# Patient Record
Sex: Female | Born: 1955 | Race: Black or African American | Hispanic: No | Smoking: Never smoker
Health system: Southern US, Community
[De-identification: ages and names within clinical notes are randomized; demographics above are authoritative.]

## PROBLEM LIST (undated history)

## (undated) DIAGNOSIS — R011 Cardiac murmur, unspecified: Secondary | ICD-10-CM

## (undated) DIAGNOSIS — I2510 Atherosclerotic heart disease of native coronary artery without angina pectoris: Secondary | ICD-10-CM

## (undated) DIAGNOSIS — E785 Hyperlipidemia, unspecified: Secondary | ICD-10-CM

## (undated) DIAGNOSIS — I10 Essential (primary) hypertension: Secondary | ICD-10-CM

## (undated) DIAGNOSIS — I739 Peripheral vascular disease, unspecified: Secondary | ICD-10-CM

## (undated) DIAGNOSIS — I701 Atherosclerosis of renal artery: Secondary | ICD-10-CM

## (undated) DIAGNOSIS — I6529 Occlusion and stenosis of unspecified carotid artery: Secondary | ICD-10-CM

## (undated) HISTORY — DX: Occlusion and stenosis of unspecified carotid artery: I65.29

## (undated) HISTORY — DX: Hyperlipidemia, unspecified: E78.5

## (undated) HISTORY — DX: Cardiac murmur, unspecified: R01.1

## (undated) HISTORY — DX: Peripheral vascular disease, unspecified: I73.9

## (undated) HISTORY — DX: Essential (primary) hypertension: I10

## (undated) HISTORY — DX: Atherosclerosis of renal artery: I70.1

## (undated) HISTORY — DX: Atherosclerotic heart disease of native coronary artery without angina pectoris: I25.10

---

## 1999-11-28 ENCOUNTER — Ambulatory Visit: Admission: RE | Admit: 1999-11-28 | Discharge: 1999-11-28 | Payer: Self-pay | Admitting: Cardiovascular Disease

## 1999-11-28 ENCOUNTER — Encounter: Payer: Self-pay | Admitting: Cardiovascular Disease

## 1999-11-28 HISTORY — PX: RENAL ANGIOGRAM: SHX6061

## 2003-09-12 HISTORY — PX: CARDIAC CATHETERIZATION: SHX172

## 2003-09-13 ENCOUNTER — Inpatient Hospital Stay (HOSPITAL_COMMUNITY): Admission: AD | Admit: 2003-09-13 | Discharge: 2003-09-18 | Payer: Self-pay | Admitting: *Deleted

## 2003-09-13 HISTORY — PX: CORONARY ARTERY BYPASS GRAFT: SHX141

## 2003-10-10 ENCOUNTER — Encounter: Admission: RE | Admit: 2003-10-10 | Discharge: 2003-10-10 | Payer: Self-pay | Admitting: Surgery

## 2003-11-13 ENCOUNTER — Encounter (HOSPITAL_COMMUNITY): Admission: RE | Admit: 2003-11-13 | Discharge: 2004-02-11 | Payer: Self-pay | Admitting: *Deleted

## 2007-08-05 ENCOUNTER — Ambulatory Visit (HOSPITAL_COMMUNITY): Admission: RE | Admit: 2007-08-05 | Discharge: 2007-08-05 | Payer: Self-pay | Admitting: *Deleted

## 2007-08-05 HISTORY — PX: CARDIAC CATHETERIZATION: SHX172

## 2007-09-01 ENCOUNTER — Observation Stay (HOSPITAL_COMMUNITY): Admission: EM | Admit: 2007-09-01 | Discharge: 2007-09-02 | Payer: Self-pay | Admitting: Emergency Medicine

## 2008-11-03 ENCOUNTER — Encounter: Admission: RE | Admit: 2008-11-03 | Discharge: 2008-11-03 | Payer: Self-pay

## 2008-11-07 ENCOUNTER — Ambulatory Visit (HOSPITAL_COMMUNITY): Admission: RE | Admit: 2008-11-07 | Discharge: 2008-11-07 | Payer: Self-pay | Admitting: Cardiovascular Disease

## 2008-11-07 HISTORY — PX: CAROTID ANGIOGRAM: SHX5765

## 2008-12-07 ENCOUNTER — Inpatient Hospital Stay (HOSPITAL_COMMUNITY): Admission: RE | Admit: 2008-12-07 | Discharge: 2008-12-08 | Payer: Self-pay | Admitting: Interventional Radiology

## 2008-12-07 HISTORY — PX: CAROTID ANGIOGRAM: SHX5765

## 2008-12-23 HISTORY — PX: RENAL ARTERY STENT: SHX2321

## 2008-12-25 ENCOUNTER — Inpatient Hospital Stay (HOSPITAL_COMMUNITY): Admission: RE | Admit: 2008-12-25 | Discharge: 2008-12-26 | Payer: Self-pay | Admitting: Cardiology

## 2009-08-08 ENCOUNTER — Ambulatory Visit (HOSPITAL_COMMUNITY): Admission: RE | Admit: 2009-08-08 | Discharge: 2009-08-08 | Payer: Self-pay | Admitting: Interventional Radiology

## 2010-09-29 LAB — CBC
Hemoglobin: 12.7 g/dL (ref 12.0–15.0)
MCHC: 33.3 g/dL (ref 30.0–36.0)
MCV: 92.5 fL (ref 78.0–100.0)
Platelets: 158 10*3/uL (ref 150–400)
RBC: 4.12 MIL/uL (ref 3.87–5.11)
WBC: 4.4 10*3/uL (ref 4.0–10.5)

## 2010-09-29 LAB — BASIC METABOLIC PANEL
CO2: 28 mEq/L (ref 19–32)
Calcium: 9 mg/dL (ref 8.4–10.5)
Creatinine, Ser: 1.06 mg/dL (ref 0.4–1.2)
GFR calc Af Amer: >60 mL/min (ref >60)

## 2010-09-29 LAB — PROTIME-INR: Prothrombin Time: 14 seconds (ref 11.6–15.2)

## 2010-10-21 LAB — BASIC METABOLIC PANEL
BUN: 19 mg/dL (ref 6–23)
BUN: 21 mg/dL (ref 6–23)
CO2: 27 mEq/L (ref 19–32)
Chloride: 104 mEq/L (ref 96–112)
GFR calc non Af Amer: 52 mL/min — ABNORMAL LOW (ref >60)
GFR calc non Af Amer: 58 mL/min — ABNORMAL LOW (ref >60)
Glucose, Bld: 134 mg/dL — ABNORMAL HIGH (ref 70–99)
Potassium: 3.9 mEq/L (ref 3.5–5.1)
Sodium: 142 mEq/L (ref 135–145)

## 2010-10-21 LAB — CBC
MCHC: 34.1 g/dL (ref 30.0–36.0)
MCV: 88.6 fL (ref 78.0–100.0)
Platelets: 184 10*3/uL (ref 150–400)
WBC: 13.9 10*3/uL — ABNORMAL HIGH (ref 4.0–10.5)

## 2010-10-22 LAB — PROTIME-INR
INR: 1.1 (ref 0.00–1.49)
INR: 1.5 (ref 0.00–1.49)
Prothrombin Time: 18.4 seconds — ABNORMAL HIGH (ref 11.6–15.2)

## 2010-10-22 LAB — CBC
Hemoglobin: 12 g/dL (ref 12.0–15.0)
RBC: 4.37 MIL/uL (ref 3.87–5.11)
RDW: 14.4 % (ref 11.5–15.5)
WBC: 6.7 10*3/uL (ref 4.0–10.5)

## 2010-10-22 LAB — DIFFERENTIAL
Basophils Relative: 1 % (ref 0–1)
Lymphocytes Relative: 33 % (ref 12–46)
Lymphs Abs: 2.2 10*3/uL (ref 0.7–4.0)
Monocytes Relative: 10 % (ref 3–12)
Neutro Abs: 3.5 10*3/uL (ref 1.7–7.7)
Neutrophils Relative %: 52 % (ref 43–77)

## 2010-10-22 LAB — BASIC METABOLIC PANEL
Calcium: 8.1 mg/dL — ABNORMAL LOW (ref 8.4–10.5)
Calcium: 9.3 mg/dL (ref 8.4–10.5)
Creatinine, Ser: 1.08 mg/dL (ref 0.4–1.2)
GFR calc Af Amer: >60 mL/min (ref >60)
GFR calc Af Amer: >60 mL/min (ref >60)
GFR calc non Af Amer: 59 mL/min — ABNORMAL LOW (ref >60)
Glucose, Bld: 185 mg/dL — ABNORMAL HIGH (ref 70–99)
Sodium: 137 mEq/L (ref 135–145)
Sodium: 140 mEq/L (ref 135–145)

## 2010-10-22 LAB — HEPARIN LEVEL (UNFRACTIONATED): Heparin Unfractionated: 0.25 IU/mL — ABNORMAL LOW (ref 0.30–0.70)

## 2010-10-22 LAB — APTT: aPTT: 27 seconds (ref 24–37)

## 2010-11-26 NOTE — Cardiovascular Report (Signed)
[name], [name]                ACCOUNT NO.:  [id number]   MEDICAL RECORD NO.:  [id number]          PATIENT TYPE:  OIB   LOCATION:  2854                         FACILITY:  [facility]   PHYSICIAN:  Darlin Priestly, MD  DATE OF BIRTH:  06-Jun-1956   DATE OF PROCEDURE:  08/05/2007  DATE OF DISCHARGE:                            CARDIAC CATHETERIZATION   PROCEDURES:  1. Left heart catheterization.  2. Coronary angiography.  3. Left ventriculogram.  4. Saphenous vein graft angiography.  5. Abdominal aortogram.   ATTENDING PHYSICIAN:  Darlin Priestly, M.D.   COMPLICATIONS:  None.   INDICATIONS:  Ms. Birkey is a 55 year old female with a history of  hypertension, history of PVD with known 70-80% left carotid stenosis as  well as abdominal aneurysm.  She does have significant CAD with a  history of subtotal occlusion of left main LAD and circumflex all  collateralized from the RCA by cath in March 2005.  She subsequently  underwent bypass surgery consisting of free RIMA to the LAD, vein graft  to the OM and vein graft to diagonal.  She recently underwent a follow-  up stress test revealing mild anterior and lateral wall ischemia.  She  is now referred for repeat catheterization to reassess her grafts.   DESCRIPTION OF OPERATION:  After obtaining informed consent, the patient  was brought to the cardiac cath lab.  Right groin was shaved, prepped  and draped in the usual sterile fashion.  ECG monitor was established.  Using modified Seldinger technique, a 6-French arterial sheath was  inserted in the right femoral artery.  6-French diagnostic catheters  performed diagnostic angiography.   Left main is subtotally occluded.   The LAD and circumflex are both subtotally occluded.   There is a patent free RIMA with insertion to the midportion of the LAD.  The LAD gives rise to two small diagonal branches.  There is no  significant disease in the free RIMA or LAD or diagonal system  beyond  the insertion.  From the same vein graft hood, the vein graft to the OM  originates.  The vein graft does fill the lower obtuse marginal which  bifurcates distally.  This vein graft is widely patent with no  significant disease in the graft or distal graft insertion.  This does  retrograde fill the AV groove circumflex.   The vein graft to the diagonal is totally occluded in its ostial  portion.   The right coronary artery is a large vessel which is dominant and gives  rise to the PDA as well as posterolateral branch.  There is mild 20%  distal RCA narrowing.  The PDA and posterolateral branch have no  significant disease.   Left ventriculogram reveals preserved EF at 50-55%.   Abdominal aortogram reveals 60% stenosis in the distal aorta just below  the renals.  There is a small aneurysm present which appears unchanged.   HEMODYNAMIC RESULTS:  Systemic arterial pressure 128/66, LV systemic  pressure 120/4, PA of 9.   CONCLUSION:  1. Significant left main and two-vessel coronary artery disease.  2. Patent  free right internal mammary artery to the left anterior      descending coronary artery with no significant disease in the body      of the graft or distal graft insertion.  3. Patent vein graft to the obtuse marginal with no significant      disease in the vein graft or distal to the graft insertion.  4. Totally occluded vein graft to the diagonal.  5. Normal left ventricular systolic function.  6. 60% distal aortic stenosis infrarenal with small infrarenal      aneurysm present.      Darlin Priestly, MD  Electronically Signed     RHM/MEDQ  D:  08/05/2007  T:  08/05/2007  Job:  [id number]

## 2010-11-26 NOTE — Discharge Summary (Signed)
[name], [name]                ACCOUNT NO.:  [id number]   MEDICAL RECORD NO.:  [id number]          PATIENT TYPE:  OIB   LOCATION:  3111                         FACILITY:  [facility]   PHYSICIAN:  Sanjeev K. Deveshwar, M.D.DATE OF BIRTH:  01-Jun-1956   DATE OF ADMISSION:  12/07/2008  DATE OF DISCHARGE:  12/08/2008                               DISCHARGE SUMMARY   CHIEF COMPLAINT:  Cerebrovascular disease status post right internal  carotid artery stent-assisted angioplasty performed on Dec 07, 2008.   BRIEF HISTORY:  This is a very pleasant 55 year old female from the  [place] who was referred to Dr. Corliss Skains through the courtesy of  Dr. Jacinto Halim for evaluation of cerebrovascular disease.  The patient had an  angiogram performed on November 07, 2008, by Dr. Jacinto Halim.  The intracranial  portion of the study was interpreted by Dr. Alfredo Batty on November 08, 2008.  Dr. Alfredo Batty felt that there were possibly 2 small aneurysms, one in the  right posterior inferior cerebellar artery and one in the basilar tip  artery.  Both were noted to be small.  There was also an outpouching  along the left supraclinoid internal carotid artery.  There was a high-  grade stenosis of the right distal cavernous and internal carotid artery  with poststenotic dilatation and delayed flow to the right middle  cerebral artery branches.  There was a filling defect of the right  anterior communicating artery via the left-sided injection by a patent  intercommunicating artery.   Dr. Corliss Skains saw the patient in consultation on Dec 07, 2008, to  discuss treatment options and further recommendations.  At that time,  stent-assisted angioplasty of the right internal carotid artery was  discussed along with the potential risks and benefits.  The patient and  her husband made a decision to proceed with endovascular treatment of  the stenosis.  The patient was admitted to [facility] on Dec 07, 2008, for that procedure.   PAST MEDICAL HISTORY:  The patient has a history of diffuse vascular  disease involving the abdominal aorta, the renal arteries, the lower  extremity vasculature as well as the cerebral and cardiac vasculature.  She has a long history of hypertension since age 86.  She has a history  of hyperlipidemia.  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  The patient had an MRI of the head on November 07, 2008, that did  not reveal any infarcts.  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  Her ejection fraction was estimated to be 50-55% at the time of  her cardiac catheterization performed on August 05, 2007.   SURGICAL HISTORY:  The patient had coronary artery bypass graft surgery  performed by Dr. Evelene Croon in March 2005.  She denies any previous  problems with anesthesia.   ALLERGIES:  The patient reported that she developed a rash approximately  2 days following her most recent angiogram.  Because of this, a decision  was made to  treat her with IV Benadryl and IV Solu-Medrol prior to her  intervention this admission.   Medications at the time of admission included Niaspan, Catapres, Zocor,  Norvasc, Micardis, Toprol, multivitamin, 81 mg of aspirin per day, and  75 mg of Plavix per day.   SOCIAL HISTORY:  The patient is married.  She lives in [place].  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  Her husband is a Designer, jewellery at [employer].  The patient does not smoke or use alcohol.   FAMILY HISTORY:  The patient's mother died at age 56.  Her father died  in his 37s.  There is a strong family history of cardiovascular and  peripheral vascular disease.   HOSPITAL COURSE:  The patient was admitted to [facility] on Dec 07, 2008, by Dr. Corliss Skains for endovascular treatment of a severe right  internal carotid artery stenosis.  A  cerebral angiogram was repeated on  the day of admission and a stent-assisted angioplasty was attempted.  The angioplasty was performed; however, the stent could not be placed in  the intended position due to severe tortuosity of the vessel.  Please  see Dr. Fatima Sanger dictation for complete details of the procedure.  Dr. Corliss Skains did feel that he obtained a good result with the  angioplasty.  The stent was deployed I believe proximal to the actual  stenosis.   The patient tolerated the procedure well.  She was admitted to the neuro  intensive care unit and remained on IV heparin overnight.  The following  day the IV heparin was discontinued and her right femoral groin sheath  was removed.  The patient is currently on bedrest.  Following 6 hours of  bedrest, she will be ambulated, if she remains stable, the plan will be  for discharge later today.   On the day of discharge, her blood pressure was running mildly low.  For  this reason, we did hold her Norvasc and Micardis that morning.  We did  give her her Catapres and Toprol as scheduled along with her aspirin and  Plavix.   LABORATORY DATA:  A basic metabolic panel on Dec 08, 2008, revealed a  BUN of 21, creatinine was 0.99.  Her GFR was 59, potassium was 38,  glucose was 185.  She had received steroids the day previously.  CBC on  Dec 08, 2008, revealed a hemoglobin of 12.0, hematocrit was 35.2, the  white blood cell count was 12, 800 again felt secondary to steroids, her  platelet count was 161,000.   DISCHARGE MEDICATIONS:  The patient was told to stay on the medication  she was on prior to admission including her aspirin and Plavix.  Please  see the list as noted above.  There were no new medications started this  admission.   The patient was given instructions regarding wound care.  She was told  not to drive or do anything strenuous for at least 2 weeks.  She was not  to return to work until cleared by Dr. Corliss Skains.    Dr. Corliss Skains will see the patient back in approximately 2 weeks  following discharge.  She will see Dr. Jacinto Halim as needed or as scheduled.  The patient does not have a primary care physician.   DISCHARGE DIAGNOSES:  1. Status post angioplasty of a severe right internal carotid artery      stenosis with attempted stent placement performed on Dec 07, 2008,  by Dr. Corliss Skains under general anesthesia.  2. Recent angiogram performed on November 07, 2008, by Dr. Jacinto Halim showing      diffuse vascular disease including an 80% left renal artery      stenosis, a totally occluded right renal artery, and possibly 2      small aneurysms.  3. History of coronary artery disease with coronary artery bypass      graft surgery performed in March 2005 by Dr. Evelene Croon.  4. Ejection fraction of 50-55% by heart catheterization performed      August 05, 2007, by Dr. Shirlee Latch.  5. History of hypertension.  6. History of hyperlipidemia.  7. Suspected contrast dye allergy treated with Solu-Medrol and      Benadryl prior to the intervention.  8. Elevated glucose levels with no history of diabetes with the Solu-      Medrol felt to be a contributing factor.  9. Elevated white blood cell count felt to be secondary to Solu-      Medrol.  The patient was afebrile.  10.Chest x-ray on November 03, 2008, showing stable mild cardiomegaly      with no active lung disease.      Delton See, P.A.    ______________________________  Grandville Silos. Corliss Skains, M.D.    DR/MEDQ  D:  12/08/2008  T:  12/09/2008  Job:  [id number]

## 2010-11-26 NOTE — Consult Note (Signed)
NAMESEHAJ, [name]                ACCOUNT NO.:  [id number]   MEDICAL RECORD NO.:  [id number]          PATIENT TYPE:  AMB   LOCATION:  SDS                          FACILITY:  [facility]   PHYSICIAN:  Sanjeev K. Deveshwar, M.D.DATE OF BIRTH:  1956-03-04   DATE OF CONSULTATION:  12/21/2008  DATE OF DISCHARGE:                                 CONSULTATION   DATE OF FOLLOWUP VISIT:  December 21, 2008.   CHIEF COMPLAINT:  Status post stent-assisted angioplasty of a severely  stenosed right internal carotid artery, performed on Dec 07, 2008.   BRIEF HISTORY:  This is a very pleasant 55 year old female from [place] was referred to Dr. Corliss Skains through the courtesy of Dr.  Jacinto Halim for evaluation of cerebrovascular disease.  An arteriogram had  been performed on November 07, 2008, by Dr. Jacinto Halim which was interpreted by  Dr. Marin Roberts.  The patient was found to have a high-grade  stenosis of the right distal cavernous and internal carotid artery with  poststenotic dilatation and delayed flow to the right middle cerebral  artery branches.  Dr. Corliss Skains saw the patient in consultation on Dec 07, 2008.  At that time, treatment options were discussed along with the  potential risks and benefits of endovascular stent-assisted angioplasty.  The patient and her husband decided to proceed with the intervention.  The stent-assisted angioplasty was performed on Dec 07, 2008, by Dr.  Corliss Skains under general anesthesia with no immediate or known  complications.  The patient returns today accompanied by her husband to  be seen in followup.   PAST MEDICAL HISTORY:  Significant for diffuse vascular disease of the  abdominal aorta, renal arteries, the lower extremities, cerebral, and  coronary arteries.  The patient is status post coronary artery bypass  graft surgery.  She has a long history of hypertension since the age of  69.  She is on multiple antihypertensive medications.  Her blood  pressure  did run low during her recent hospital admission and her  medications were adjusted.  The patient has a totally occluded right  renal artery.  She has an 80% stenosis of the left renal artery.  She  tells [place] today that she is scheduled to undergo a stent-assisted  angioplasty to be performed by Dr. Jacinto Halim on December 25, 2008.   The patient had an MRI of the brain on November 07, 2008, that did not  reveal any infarcts.  She has degenerative joint disease of her knees.  She has an ejection fraction of 50-55% by cardiac catheterization  performed in January 2009.   SURGICAL HISTORY:  The patient is status post coronary artery bypass  graft surgery performed by Dr. Evelene Croon in March 2005.  She denies  any previous problems with anesthesia.   ALLERGIES:  The patient developed a rash 2 days after her angiogram  performed by Dr. Jacinto Halim.  For this reason, we gave her IV Benadryl and IV  Solu-Medrol prior to her intervention on Dec 07, 2008.   CURRENT MEDICATIONS:  The patient is now on aspirin 325 mg daily and  Plavix 75 mg daily, which Dr. Corliss Skains recommended continuing for at  least a year.  She is also on Niaspan, Zocor, Catapres, Norvasc,  Micardis, Toprol, and a multivitamin.   SOCIAL HISTORY:  The patient is married.  She lives in [place].  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  Her husband is a Designer, jewellery at the [employer].  The patient does not smoke or use alcohol.   FAMILY HISTORY:  The patient's mother died at age 81.  Her father died  in his 110s.  There is a strong family history of cardiovascular disease  as well as peripheral vascular disease.   IMPRESSION AND PLAN:  The patient returns today to be seen in followup  by Dr. Corliss Skains after undergoing stent-assisted angioplasty of the  right internal carotid artery on Dec 07, 2008.  She is accompanied by  her husband.  The patient and her husband report  that she has been doing  well.  She continues on the above-noted medications including aspirin  and Plavix.  She has had no symptoms other than occasional dizziness  which she experiences if she does not take her blood pressure  medications.  Her blood pressure had been running mildly low during her  last hospitalization and we gave her husband some parameters regarding  which medications to hold if her blood pressure is too low.  We  recommended not holding the Catapres for fear of rebound hypertension.  We also recommended not holding her beta-blocker due to her history of  coronary artery disease.  They were told they could hold the Norvasc or  Micardis if her blood pressure was significantly low.   Dr. Corliss Skains has recommended continuing the Plavix for at least a year.  She is also to remain on aspirin 325 mg daily.   Dr. Corliss Skains reviewed the images from her recent procedure.  He showed  the patient and her husband pre and post-angioplasty images.  All of  their questions were answered.  Dr. Corliss Skains did give the patient  permission to resume driving and to return to work on June 14.  As noted  she is due to have a renal angioplasty on June 14.  We filled out a  disability form for the patient to cover her until the 14th.  Further  recommendations will be made by Dr. Jacinto Halim after the renal angioplasty.  The plan at this time would be to repeat a cerebral angiogram in  approximately 3 months.  We did recommend that she discuss further blood  pressure medication adjustments with Dr. Jacinto Halim.  Greater than 25 minutes  was spent on this follow up visit.      Delton See, P.A.    ______________________________  Grandville Silos. Corliss Skains, M.D.    DR/MEDQ  D:  12/21/2008  T:  12/22/2008  Job:  [id number]   cc:   Cristy Hilts. Jacinto Halim, MD

## 2010-11-26 NOTE — Cardiovascular Report (Signed)
[name], [name]                ACCOUNT NO.:  [id number]   MEDICAL RECORD NO.:  [id number]           PATIENT TYPE:   LOCATION:                                 FACILITY:   PHYSICIAN:  Vonna Kotyk R. Jacinto Halim, MD       DATE OF BIRTH:  [birth year]   DATE OF PROCEDURE:  12/23/2008  DATE OF DISCHARGE:                            CARDIAC CATHETERIZATION   PROCEDURE PERFORMED:  1. Abdominal aortogram.  2. Selective left femoral renal arteriography.  3. PTA and stenting of the left femoral artery.   INDICATIONS:  [name] is a 55 year old female with renovascular  hypertension, renal atherosclerosis single renal artery with a high-  grade left renal artery stenosis of 80% with greater than 70-80 mm  pressure gradient by the abdominal aortogram that was done recently  about a month ago.  Because of renal preservation and increasing  velocities across the left renal artery which has been on close  surveillance we felt that it was prudent to proceed with renal  angioplasty.   ABDOMINAL AORTOGRAM:  Abdominal aortogram revealed diffuse calcification  of the abdominal aorta.  There was a 50% infrarenal aortic stenoses.   Left renal artery shows a calcific 80% stenosis.   INTERVENTION DATA:  Successful PTA and stenting of the left renal artery  with implantation of a 5.5- x 15-mm Herculink Plus stent deployed at  peak of 12 atmospheric pressure.  The stenosis overall was reduced from  80% to 0% with excellent flow.  There was no pressure gradient across  the renal artery postangioplasty.   Total of 70 mL of contrast was utilized for diagnostic and  interventional procedure.   RECOMMENDATION:  The patient will be continued on aspirin and Plavix.  She is extremely high risk.  She is on appropriate medical and  aggressive medical therapy with high-dose statins and also Niaspan.  We  will continue the same.   TECHNIQUE OF PROCEDURE:  Under usual sterile precautions using a 6-  French left  femoral arterial access, a 6-French double renal artery  curve guide catheter was initially advanced to the abdominal aorta, but  because of inability to engage the renal artery, I used a 6-French LIMA  guide catheter and left renal artery was selectively cannulated.  Using  stabilizer guidewire and heparin for anticoagulation maintaining ACT of  greater than 200, balloon angioplasty was performed after crossing the  renal artery with a 4.0- x 16-mm Aviator balloon at 12 atmospheric  pressure for 40 seconds followed by stent implantation at 12 atmospheric  pressure for 40 seconds, and  the inflow of the stent was dilated at the same pressure for 20 seconds.  Overall, excellent results were noted.  The guidewire was withdrawn, and  guide catheter disengaged and pulled out of the body over a J-wire.  The  patient tolerated the procedure well.  No immediate complication was  noted.      [name]. Jacinto Halim, MD     JRG/MEDQ  D:  12/23/2008  T:  12/25/2008  Job:  [id number]
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
## 2010-11-26 NOTE — Discharge Summary (Signed)
[name], SASAKI                ACCOUNT NO.:  [id number]   MEDICAL RECORD NO.:  [id number]          PATIENT TYPE:  OIB   LOCATION:  3111                         FACILITY:  [facility]   PHYSICIAN:  Sanjeev K. Deveshwar, M.D.DATE OF BIRTH:  03-09-1956   DATE OF ADMISSION:  12/07/2008  DATE OF DISCHARGE:  12/08/2008                               DISCHARGE SUMMARY   ADDENDUM:  In the previous dictation of this discharge summary, I  mistakenly dictated that the stent had been deployed proximal to the  stenosis.  That was an error on my part.  The stent was actually  deployed in the proper position at the site of the stenosis.  The stent  was not fully expanded; however, there is hope that the stent will  continue to expand over time.  Please see Dr. Grandville Silos. Deveshwar's  dictated report for full details.   It was noted that the patient's blood pressure was running somewhat low  during her stay.  Her Micardis and Norvasc were held on the day of  discharge.  We also requested that her husband, who is a Landscape architect, check the patient's blood pressure at home.  He was given  parameters with regards to holding the Micardis and the Norvasc if  necessary.  He will hold both medications for a systolic blood pressure  of less than 120.  He will hold the Norvasc for a systolic blood  pressure of less than 140.  The Catapres and the metoprolol will be  continued as previously.      Delton See, P.A.    ______________________________  Grandville Silos. Corliss Skains, M.D.    DR/MEDQ  D:  12/08/2008  T:  12/08/2008  Job:  [id number]   cc:   Cristy Hilts. Jacinto Halim, MD

## 2010-11-26 NOTE — Cardiovascular Report (Signed)
NAMEMACKENZE, GRANDISON                ACCOUNT NO.:  [id number]   MEDICAL RECORD NO.:  [id number]          PATIENT TYPE:  AMB   LOCATION:  SDS                          FACILITY:  [facility]   PHYSICIAN:  Vonna Kotyk R. Jacinto Halim, MD       DATE OF BIRTH:  1955/08/29   DATE OF PROCEDURE:  11/07/2008  DATE OF DISCHARGE:  11/07/2008                            CARDIAC CATHETERIZATION   Primary operator: Jeanella Cara, MD  Assist: Nanetta Batty, MD   PROCEDURE PERFORMED:  1. Arch aortogram.  2. Selective cerebral angiography intracranial and extracranial.  3. Abdominal aortogram.  4. Selective left renal arteriography.   INDICATIONS:  Ms. Ndeye Tenorio is a 55 year old female with known  coronary artery disease and coronary artery bypass grafting in the past.  She also has hypertension.  She is vasculopath in spite of being on a  very aggressive medical therapy.  She has shown significant progression  of carotid velocities across the left internal carotid artery.  Given  this she was brought for cerebral angiography to evaluate the severity  of stenosis.  Renal arteriography was performed because of elevated  velocities and no renal artery stenosis from prior cardiac  catheterization.  She also has a new right renal artery occlusion by  renal Dopplers that were done on September 20, 2008.  Hence she was brought  to the Cardiac Catheterization Lab to evaluate her anatomy.   Arch aortogram.  The arch aortogram revealed mild calcification of the  arch of the aorta.  This is a type 1 arch.   Right coronary artery.  Right coronary artery is widely patent with mild  luminal irregularity.  There is a 50% stenosis in right internal carotid  artery.  External carotid artery shows a 30-40% stenosis.  Please see  the dictation.   For intracranial carotid arterial system, please see the dictation from  Radiology.   Left carotid artery.  Left common carotid artery showed mild  calcification and mild disease.  Left  internal carotid artery shows mild  diffuse disease with 30% stenosis.  Left external carotid artery shows a  high-grade 90% stenosis.   Left vertebral artery.  Left vertebral artery is a large vessel.  It is  smooth and normal.  It fills the contralateral vertebral arterial  system.  There is complicated filling noted.   Intracranial portion of the left vertebral artery please see the  dictation of Radiology.   Right vertebral artery.  Right vertebral artery is widely patent.  Because of severe calcification and tortuosity, the right vertebral  artery was not selectively cannulated.   IMPRESSION:  The velocities across the left internal carotid artery were  false.  This is secondary to acute angle and tortuosity and severe  calcification  and this essentially involves the external carotid artery  without involvement of the internal carotid artery.  Hence medical  therapy is only advised.   Preliminary evaluation of her cerebral arterial circulation intracranial  portion reveals high-grade stenosis of the right internal carotid artery  at the petrous portion before the origin of the M1 segment of the  middle  cerebral artery.  There is underfilling of the anterior cerebral artery.   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  I have requested Dr.  Kerby Nora to evaluate the cerebral angiography.  She may need  angioplasty of the right internal carotid artery intracranial portion.   Abdominal aortogram.  Abdominal aortogram revealed presence of one renal  artery on the left side.  It is heavily calcified.  Abdominal aorta  shows heavy calcification and a 50% stenosis of the infrarenal portion  of the abdominal aorta.   Selective renal arteriography.  There was a high-grade stenosis of 80%  in the left renal artery with pressure gradient greater than  70-80  mmHg.   Abdominal aortogram also revealed  a 50% stenosed abdominal aorta without  any significant pressure gradient.  (15 mmHg).  There is severe  tortuosity and severe calcification noted and it has appearance of a  porcine aorta.   RECOMMENDATIONS:  She will need renal angioplasty of the left renal  artery.  There is a new occlusion of the right renal artery.  She is  essentially behaving like a vasculopath.  She is already on very  aggressive medical therapy.   TECHNIQUE OF THE PROCEDURE:  Under usual sterile precautions using a 5-  French pigtail catheter an arch aortogram was performed in LAO  projection.   Using JB-1 catheter, selective cerebral arteriography was performed.  The catheter then pulled out of the body.  An abdominal aortogram was  performed through the pigtail catheter and selective renal arteriography  was performed through a 5-French JR-4 diagnostic catheter.  End-hole  catheter was utilized to perform a selective pullback across the  abdominal aortic stenosis.  The patient tolerated the procedure.  A  total of 220 mL of contrast was utilized for the diagnostic procedure.      Cristy Hilts. Jacinto Halim, MD  Electronically Signed     JRG/MEDQ  D:  11/07/2008  T:  11/07/2008  Job:  [id number]   cc:   Kerby Nora, MD

## 2010-11-26 NOTE — H&P (Signed)
NAMEIVELIS, NORGARD                ACCOUNT NO.:  [id number]   MEDICAL RECORD NO.:  [id number]          PATIENT TYPE:  OIB   LOCATION:  3172                         FACILITY:  [facility]   PHYSICIAN:  Sanjeev K. Deveshwar, M.D.DATE OF BIRTH:  Sep 03, 1955   DATE OF ADMISSION:  12/07/2008  DATE OF DISCHARGE:                              HISTORY & PHYSICAL   CHIEF COMPLAINT:  Cerebrovascular disease.   HISTORY OF PRESENT ILLNESS:  This is a pleasant 55 year old female from  [place] who was referred to Dr. Corliss Skains through the courtesy  of Dr. Jacinto Halim for evaluation of cerebrovascular disease.  The patient has  a history of diffuse vascular disease.  She had an angiogram performed  on November 07, 2008, by Dr. Jacinto Halim.  The intracranial portion of the study  was interpreted by Dr. Alfredo Batty on November 08, 2008.  Dr. Alfredo Batty felt  there was a question of a right posterior inferior cerebellar artery and  basilar tip aneurysms, both felt to be small.  There was an outpouching  along the left supraclinoid internal carotid artery.  There was a high-  grade stenosis of the right distal cavernous and internal carotid artery  with post-stenotic dilatation and delayed flow to the right middle  cerebral artery branches.  There was filling of the right anterior  communicating artery via the left-sided injection by a patent anterior  communicating artery.   Dr. Corliss Skains saw the patient in consultation on Dec 07, 2008, to  discuss treatment options and further recommendations.  At that time  stent-assisted angioplasty of the right internal carotid artery was  discussed along with the potential risks and benefits.  The patient and  her husband made a decision to proceed with endovascular treatment.  The  patient is admitted to [facility] today, Dec 07, 2008, for that  procedure.   PAST MEDICAL HISTORY:  Significant for diffuse vascular disease  involving the abdominal aorta, the renal arteries,  the lower extremity  vasculature as well as the cerebral and cardiac vasculature.  The  patient has a long history of hypertension since the age of 84.  She  also has a history of hyperlipidemia.  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  She had an MRI of the head on November 07, 2008, that showed no sign of a  previous cerebrovascular accident.  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  Her ejection fraction was estimated to be 50-55% at time  of a cardiac catheterization performed August 05, 2007.   SURGICAL HISTORY:  The patient had coronary artery bypass graft surgery  performed September 13, 2003, by Dr. Laneta Simmers.  She denies any previous  problems with anesthesia.   ALLERGIES:  The patient has no known drug allergies.  She does report  that she developed a rash 2 days after her most recent angiogram.  For  this reason we will provide her with prophylaxis with IV Benadryl 50 mg  as well as IV Solu-Medrol 125 mg prior to the procedure  today.   MEDICATIONS ON ADMISSION:  Niaspan, Catapres, Zocor, Norvasc, Micardis,  Toprol, multivitamin, aspirin 81 mg and Plavix 75 mg   SOCIAL HISTORY:  The patient is married.  She lives in [place].  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  Her husband is a Designer, jewellery at [employer].  The patient does not smoke or use alcohol.   FAMILY HISTORY:  The patient's mother died at age 48.  Her father died  in his 72s.  There is a strong family history of cardiovascular and  peripheral vascular disease.   REVIEW OF SYSTEMS:  A complete review of systems was completely negative  at time of admission except for arthritic pain in her knee.   LABORATORY DATA:  An INR on admission was 1.1 with a PT of 15, the PTT  was 27.  BUN was 15, creatinine was 1.08, potassium 3.8, her GFR was 53,  glucose was 148.  Hemoglobin 13.3, hematocrit  38.4, WBC 6.7 thousand,  platelets 188,000.  A chest x-ray on April 23 showed stable mild  cardiomegaly with no active lung disease.  An MRI of the brain on November 07, 2008, showed no acute intracranial abnormalities.  There was  moderate-for-age sequelae of chronic small-vessel disease.   PHYSICAL EXAMINATION AT TIME OF ADMISSION:  A very pleasant 55 year old  female in no acute distress.  VITAL SIGNS:  Blood pressure 126/77, pulse 56, respirations 16,  temperature 97.7, oxygen saturation 100% on room air.  HEENT:  Unremarkable.  NECK:  Revealed a left carotid bruit.  HEART:  Revealed regular rate and rhythm with distant heart sounds.  LUNGS:  Clear.  ABDOMEN:  Soft, nontender.  EXTREMITIES:  Revealed pulses to be intact with trace edema bilaterally.  Her airway was rated at a 1.  Her ASA scale was rated at a 4.  NEUROLOGIC EXAM:  Revealed the patient to be alert and oriented and  following commands.  Cranial nerves II-XII were grossly intact.  Sensation was intact to light touch.  Cerebellar testing was intact.  Motor strength was 5/5 throughout.   ADMISSION DIAGNOSES:  1. Cerebrovascular disease with a severe right internal carotid artery      stenosis.  2. Recent angiogram performed November 07, 2008, by Dr. Jacinto Halim showing      diffuse vascular disease including an 80% left renal artery      stenosis and a totally-occluded right renal artery, which was felt      to be new.  3. History of coronary artery disease with coronary artery bypass      graft surgery in March 2005 performed by Dr. Laneta Simmers.  4. Ejection fraction 50-55% by cardiac catheterization performed      August 05, 2007, by Dr. Jenne Campus.  5. Two possible small aneurysms noted on the angiogram performed November 07, 2008.  6. History of hypertension.  7. History of hyperlipidemia.  8. Possible CONTRAST DYE allergy.  9. Mildly elevated glucose levels with no history of diabetes      mellitus.   PLAN:  The patient  will be admitted to [facility] today for  endovascular treatment of a severely-stenosed right internal carotid  artery to be performed by Dr. Corliss Skains.  She has been provided  prophylaxis with IV Benadryl and IV Solu-Medrol due to a possible  CONTRAST DYE allergy.  It should be noted that the patient does not have  a  primary care physician.  She lists Dr. Jacinto Halim as her only physician  other than Dr. Corliss Skains.      Delton See, P.A.    ______________________________  Grandville Silos. Corliss Skains, M.D.    DR/MEDQ  D:  12/07/2008  T:  12/07/2008  Job:  [id number]   cc:   Cristy Hilts. Jacinto Halim, MD

## 2010-11-29 NOTE — Cardiovascular Report (Signed)
[facility]  Patient:    [name], [name]                         MRN: [id number] Proc. Date: 11/28/99 Attending:  Gerlene Burdock A. Alanda Amass, M.D. CC:         Lenise Herald, M.D.             Richard A. Alanda Amass, M.D.             [facility]                        Cardiac Catheterization  PROCEDURE:  Retrograde abdominal aortic catheterization, abdominal aortic angiogram, mid stream PA and lateral projection, bilateral iliac angiography, PA and oblique projections, bilateral selective renal angiography, transstenotic pressure gradient measurement, left renal artery, and abdominal aorta.  DESCRIPTION OF PROCEDURE:  The patient was brought in as a same day admission with creatinine 0.9, hydrated preoperatively, premedicated with 5 mg of Valium p.o. and given 2 mg of Versed for sedation in the lab.  Visipaque dye was used throughout the procedure.  Procedure was done through the RFA, entered percutaneously with a 18 thin-walled needle and single anterior puncture under 1% Xylocaine.  A 5 French short Cordis side-arm sheath was inserted. A Wholey wire was used to traverse the iliac system.  Guidewire exchanges were used throughout the procedure.  A 5 French Tennis Racquet catheter was used for abdominal aortic angiogram above the level of the renal arteries at 30 cc 20 cc per second.  A second injection of 30 cc 20 cc per second was done in the lateral projection.  The catheter was pulled back across the infrarenal abdominal aortic stenosis and transstenotic pressure gradient was approximately 8 mmHg.  A second injection above the iliac bifurcation was done at 20 cc 20 cc per second.  The catheter was exchanged for a short rate and then a IMA 5 French catheter in selective left and right renal angiograms were done using hand injections.  There was transstenotic gradient across the left renal artery with approximately 10 mmHg, but there may have been some  catheter damping because of angulation accounting for this.  The IMA was pulled down and the LCIA was accessed and left iliac angiography was done in a PA and oblique projections using hand injections.  The catheter was removed and right iliac angiography was done in the PA and oblique projections.  Visualization of the profunda SFA junction was seen bilaterally and only the proximal third of the SFA.  Because of functionally single kidney, with dye considerations and no significant history of claudication, further lower extremity angiography was not performed at this time.  PRESSURES:  Arterial pressures monitored throughout the procedure ranged 160-170/80-90 mmHg.  There was approximately 8 mm gradient across the infrarenal abdominal aortic stenosis.  There was probably no significant gradient across the proximal left renal artery on catheter pullback or if present this was less than 10 mmHg.  There was significant calcification of the infrarenal abdominal aorta and iliacs bilaterally.  ABDOMINAL AORTIC ANGIOGRAM:  Abdominal aortic angiogram showed a widely celiac and SMA access with no evidence of stenosis and good ______.  The right renal artery was totally occluded just after its origin with a concave obstruction compatible with a chronic total obstruction.  Probing this superficially with the [facility] wire it was obvious that this was a chronic obstruction that was extremely hard and  probably calcified and no further attempts were made.  The left renal artery had approximately 20-30% concentric narrowing that was not felt to be angiographically significant with good flow on selective injection and abdominal angiogram and probably no significant gradient or less than 10 mmHg.  The infrarenal abdominal aorta was calcific with moderate diffuse atherosclerosis and mild dilatation of the distal third.  There was a 50% eccentric calcific stenosis of the infrarenal abdominal  aorta with a pressure at approximately 10 mm.  There was less than 10 mmHg gradient across this.  The IMA was intact.  The iliac bifurcation was intact.  There was irregularity at 20-30% right common mid iliac narrowing which tapered down to about 30-40% at the right hypogastric origin.  The right hypogastric was intact.  There was 30% eccentric narrowing of the LCIA in the midportion moderately segmental.  There was an 80% proximal--mid left hypogastric stenosis.  There was a lumpy, bumpy appearance to the LEIA with approximately 30% narrowing in its segmental 50-60% narrowing.  The distal LEIA showed no significant narrowing and mild irregularity.  The left SFA had 30% narrowing proximally with good flow down to the proximal third of the thigh.  The left profunda was intact, however, there was a focal 90% eccentric stenosis of the proximal left profunda.  The REIA had 30% segmental narrowing.  The RCFA had no significant narrowing and was smooth.  The right profunda had mildly segmental 70% proximal stenosis but good flow.  The right SFA had a eccentric 90% proximal stenosis.  There was 30% narrowing of the proximal SFA and it was visualized down to the proximal third of the thigh.  There appeared to be good runoff from the SFAs bilaterally.  DISCUSSION:  This 55 year old [place] lady has been in the [place] since the fall of October 2000.  There is no history of smoking or ETOH use and there is a family history of hypertension, and MI of a brother at age 29.  She has a sister with hypertension as well.  There is hyperlipidemia with total cholesterol of 223, LDL of 140.  No history of angina or claudication and a normal 2-D echocardiogram except for mild concentric hypertrophy and probably a mild diastolic relaxation abnormality with an EF of 60% (October 21, 1999). She has a long history of hypertension and apparently in the past was told that she had renal artery stenosis  (approximately 20 years ago), but this was  not followed up.  She is on several blood pressure medications and followup duplex study shows a small right kidney of 8.4 cm, a compensated large left kidney at 13 cm and an increase velocity of the right renal artery, greater than 5 and 192 on the left.  She was referred for renal and abdominal angiography for these reasons. Creatinine is normal at 0.9.  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  She also has 50% infrarenal abdominal aortic stenosis, bilateral iliac disease and moderately severe left external iliac.  She has a very concerning high-grade stenosis of the left profunda and a high-grade focal stenosis of her proximal right SFA that appears fairly focal.  She also has moderate right profunda stenosis.  The profunda stenosis is concerning since if she develops SFA occlusion she may not have adequate collaterals.  I would recommend continued medical therapy of her hypertension.  Part of this may be exacerbated by high renin in producing right kidney but this is unknown.  She is at exceeding strong risk of progression of her abdominal aortic stenosis, iliac disease as well and will need close followup.  I would recommend lower extremity Dopplers.  Vigorous therapy for hyperlipidemia. The case will be discussed with her primary cardiologist, Dr. Jenne Campus.  CATHETERIZATION DIAGNOSES: 1. History of renal artery stenosis. 2. History of chronic hypertension. 3. Peripheral vascular disease without clinical claudication at present. 4. Nonsmoker. 5. Normal systolic function with probable diastolic dysfunction and left    ventricular hypertrophy on two-dimensional echocardiogram. 6. Mild left renal artery narrowing. 7. Moderate infrarenal abdominal aortic calcific eccentric stenosis. 8. Moderate left external iliac segmental disease. 9. High-grade proximal right superficial femoral artery and left  profunda    stenosis. DD:  11/28/99 TD:  12/03/99 Job: [id number]

## 2010-11-29 NOTE — Discharge Summary (Signed)
[name], [name]                          ACCOUNT NO.:  [id number]   MEDICAL RECORD NO.:  [id number]                   PATIENT TYPE:  INP   LOCATION:  2027                                 FACILITY:  [facility]   PHYSICIAN:  Evelene Croon, M.D.                  DATE OF BIRTH:  04/13/1956   DATE OF ADMISSION:  09/12/2003  DATE OF DISCHARGE:  09/18/2003                                 DISCHARGE SUMMARY   ADMISSION DIAGNOSIS:  Positive Cardiolite stress test.   DISCHARGE DIAGNOSES/SECONDARY DIAGNOSES:  1. Positive Cardiolite stress test status post cardiac catheterization.  2. 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  3. History of diffuse peripheral vascular occlusive disease involving the     abdominal aorta, renal arteries, lower extremity vasculature, and     cerebral vasculature.  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  She has known     infrarenal abdominal aortic stenosis.  There is a 90% left profunda     artery stenosis and 90 right SFA occlusion.  She has been followed by Dr.     Madilyn Fireman for her peripheral vascular disease.  4. History of hypertension since age 54.  5. History of hyperlipidemia.  6. Postoperative anemia status post transfusion of packed red blood cells.   PROCEDURES:  On September 13, 2003, she underwent median sternotomy for coronary  artery bypass grafting surgery times three using a free right internal  mammary artery graft to the left anterior descending coronary artery, with  saphenous vein graft to the diagonal branch of the left anterior descending,  and a saphenous vein graft to the obtuse marginal branch of the left  circumflex coronary artery, and endoscopic vein harvesting from the right  leg.  Surgeon was Dr. Evelene Croon.   On September 12, 2003, she underwent left heart catheterization, coronary  angiography, left  ventriculogram, left subclavian angiography, and an  abdominal aortogram by Dr. Lenise Herald.  Findings showed significant left  main and two vessel coronary artery disease, mildly depressed left  ventricular function, diffusely diseased and calcified left subclavian,  diffuse disease of the distal aorta with a 50 to 60% infrarenal stenosis and  small distal aneurysm, renal artery stenosis of 30%, totally occluded right  renal artery, and systemic hypertension.   ALLERGIES:  She has no known drug allergies.   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  She has a  history of diffuse vascular disease and recently underwent a follow up  carotid duplex which showed a high grade left internal carotid artery  stenosis.  She was asymptomatic but it was felt that she should require  cerebral angiograms and treatment  of her left carotid artery stenosis.  She  underwent a Cardiolite stress test in preparation for this.  She was without  cardiac symptoms at this time.  The stress Cardiolite however, showed a  large area of reversible ischemia in the anterior apical region.  Based on  this result, Dr. Jenne Campus felt that she should undergo cardiac  catheterization.  Subsequently she was scheduled for an elective admission  to [facility] on September 12, 2003 for this procedure.   HOSPITAL COURSE:  On September 12, 2003, the patient was electively admitted to  [facility] where she underwent cardiac catheterization as described  above.  Based on these findings, Dr. Laneta Simmers was consulted regarding possible  surgical revascularization.  After review of the angiogram and examination  of the patient, Dr. Laneta Simmers did feel that coronary artery bypass grafting  surgery was the best treatment option.  After Dr. Laneta Simmers discussed the  risks, benefits and alternatives with the patient and her husband, the  patient  agreed to proceed and her surgery was scheduled for the following  day September 13, 2003.   As planned, the patient did undergo coronary artery bypass grafting surgery  on September 13, 2003.  She tolerated this procedure well and was transferred  from the operating room to the surgical intensive care unit in stable  condition.  Later that evening, she was hemodynamically stable.  She was  also extubated, neurologically intact.   On the morning of postoperative day one, the patient remained  hemodynamically stable.  She did require a Neo-Synephrine drip initially for  hypotension.  Her morning labs all showed evidence of postoperative anemia  with a hemoglobin of 7.4 and hematocrit of 21.  She was transfused with one  unit of packed red blood cells.  Her hemoglobin showed minimal improvement  to 7.7 and she did eventually require an additional unit of blood which did  increase her hemoglobin and hematocrit to 9.2 and 27.1 respectively.  Her  postoperative chest x-ray showed bibasilar atelectasis, her EKG showed  normal sinus rhythm with non-specific T-wave abnormalities.  Her chest tube  output was minimal and therefore chest tubes were discontinued without  difficulty.   Over the next several days, the patient continued to progress.  She remained  hemodynamically stable.  She eventually was weaned from supplemental oxygen  and was saturating 95% on room air.  Her bowel and bladder function were  working appropriately.  Her pain was controlled on oral medications.  Her  incisions remained clean and dry without signs of infection.  She was  ambulating with steady gait.  Her lower extremities did show trace edema and  she was slightly above her preoperative weight and did require short term of  diuretic therapy.  Dr. Sharee Pimple primary concern was her pulmonary status.  Although she had been weaned from supplemental oxygen and was without shortness of breath, her chest x-ray on September 16, 2003 showed  significant  right lower lobe atelectasis without pneumothorax.  She also has small  pleural effusions bilaterally.  Based on this finding, he did order a follow  up chest x-ray on September 18, 2003.  If her chest x-ray was stable, he did feel  that she would be stable for discharge home later that day September 18, 2003.  These results did show a stable right effusion with a slight worsening of  her left effusion.  Her films were viewed by Dr. Laneta Simmers, however, and they  were overall felt  to be stable and the patient was discharged home later  that day.   LABORATORY DATA:  Recent laboratory data on September 18, 2003 - sodium was 135,  potassium 3.9, blood glucose 105, BUN 12, creatinine 1.0, calcium 8.6, white  blood count 9.7, hemoglobin 10.4, hematocrit 30.1, platelet count 314.   DISCHARGE INSTRUCTIONS:   DISCHARGE MEDICATIONS:  1. Aspirin 81 mg one p.o. day.  2. Lopressor 25 mg one p.o. b.i.d.  3. Altace 2.5 mg p.o. daily.  4. Lipitor 40 mg one p.o. daily.  5. Ferrous sulfate 300 mg one p.o. b.i.d. x30 days.  6. Laxative of choice as needed for constipation.  7. Tylox one to two tablets p.o. q. 4 to 6 h. p.r.n. pain.   ACTIVITY:  She is instructed to avoid driving or heavy lifting more than 10  pounds.   DIET:  She is to follow a low fat, low salt diet.   WOUND CARE:  She may shower daily and may clean her incisions gently with  mild soap and water.  She is to call the [facility] office if she develops fever  or for redness or drainage from her incision sites.   FOLLOWUP:  1. She is to follow up with Dr. Laneta Simmers in the CVTS office in approximately     three weeks.  The office will contact her specific appointment date and     time.  She is to have a chest x-ray at the [facility] diagnostic center     one hour before this appointment and is to bring her chest x-ray films     with her to her appointment with Dr. Laneta Simmers.  2. She is to schedule a two week follow up appointment with Dr.  Jenne Campus.      Jerold Coombe, P.A.                  Evelene Croon, M.D.    AWZ/MEDQ  D:  10/02/2003  T:  10/04/2003  Job:  [id number]   cc:   Evelene Croon, M.D.  [address]  [place]  [place] [address]  Fax: [phone]   Darlin Priestly, M.D.  [phone] [address], [address]  [place]  [place] [address]  Fax: [phone]

## 2010-11-29 NOTE — Op Note (Signed)
NAMELINEA, CALLES                          ACCOUNT NO.:  [id number]   MEDICAL RECORD NO.:  [id number]                   PATIENT TYPE:  INP   LOCATION:  2307                                 FACILITY:  [facility]   PHYSICIAN:  Evelene Croon, M.D.                  DATE OF BIRTH:  20-Dec-1955   DATE OF PROCEDURE:  09/13/2003  DATE OF DISCHARGE:                                 OPERATIVE REPORT   PREOPERATIVE DIAGNOSES:  High grade left main and severe two-vessel coronary  artery disease.   POSTOPERATIVE DIAGNOSES:  High grade left main and severe two-vessel  coronary artery disease.   OPERATION PERFORMED:  Median sternotomy, extracorporeal circulation,  coronary artery bypass grafting surgery times three using a free right  internal mammary artery graft to the left anterior descending coronary  artery, with saphenous vein graft to the diagonal branch of the left  anterior descending and a saphenous vein graft to the obtuse marginal branch  of the left circumflex coronary artery.  Endoscopic vein harvesting from the  right leg.   SURGEON:  Alleen Borne, M.D.   ASSISTANT:  1. Kerin Perna, M.D.  2. Pecola Leisure, [place]   ANESTHESIA:  General endotracheal.   INDICATIONS FOR PROCEDURE:  The patient is a 55 year old woman with history  of diffuse vascular disease who recently underwent a follow-up carotid  duplex which showed a high grade left internal carotid artery stenosis.  She  was asymptomatic but was felt to require a cerebral angiogram and treatment  of this left carotid stenosis.  She underwent a Cardiolite stress test in  preparation for this.  She had no cardiac symptoms.  The stress Cardiolite  showed a large area of reversible ischemia in the anterior apical region.  She underwent cardiac catheterization yesterday which showed essentially  left main occlusion with 99% plus diffuse narrowing of the left main.  The  proximal LAD was also 99% stenosed before a small  diagonal branch.  The LAD  was occluded after the diagonal branch. The mid and distal LAD filled by  collaterals from a large dominant right coronary artery that had no disease  in it.  The left circumflex was occluded proximally with filling of a large  marginal branch by collaterals from the right.  Left ventricular function  was well preserved with an ejection fraction of 45 to 50% with mild global  hypokinesis.  There was no gradient across the aortic valve and no mitral  regurgitation.  The patient was also noted to have diffusely diseased left  subclavian artery without significant stenosis.  The left internal mammary  artery could not be visualized.  There was extensive calcification of the  aortic arch.  There was also severe diffuse abdominal aortic disease with an  area of 50 to 60% infrarenal aortic stenosis and a small aneurysm. 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  The left renal artery had mild  stenosis.  After review of the angiogram and examination of the patient, it  was felt that coronary artery bypass graft surgery was the best treatment.  I discussed the operative procedure with the patient and her husband, who is  a Engineer, civil (consulting) here at [facility].  I discussed alternatives to surgery,  benefits, and risks including bleeding, blood transfusion, infection,  stroke, myocardial infarction and death.  They understood and agreed to  proceed.   DESCRIPTION OF PROCEDURE:  The patient was taken to the operating room and  placed on the table in supine position.  After induction of general  endotracheal anesthesia, a Foley catheter was placed in the bladder using  sterile technique.  Then the chest, abdomen and both lower extremities were  prepped and draped in the usual sterile manner.  The chest was entered  through a median sternotomy incision and the pericardium opened in the  midline.  Examination of the heart showed good ventricular contractility.  The  ascending aorta was of normal size.  There was no palpable plaque in the  ascending aorta but there was some calcified plaque present in the aortic  arch.   Then I examined the left internal mammary artery.  This was a small atretic  appearing artery and was unsuitable for use as a bypass conduit.  Therefore,  I examined the right internal mammary artery and this was a larger caliber  vessel.  This was harvested from the chest wall as a free graft.  I had  excellent blood flow through it.  At the same time, a segment of greater  saphenous vein was harvested from the right leg with the endoscopic vein  harvest technique.   Then the patient was heparinized and when an adequate activated clotting  time was achieved, the distal ascending aorta was cannulated using a 20  French aortic cannula for arterial inflow.  Venous outflow was achieved  using a two-stage venous cannula for the right atrial appendage.  An  antegrade cardioplegia and vent cannula was inserted in the aortic root.   The patient was placed on cardiopulmonary bypass and the distal coronary  arteries identified.  The LAD was a medium-sized graftable vessel distally.  The diagonal branch was small but graftable.  The obtuse marginal was a  large graftable vessel that had some patchy plaque in it distally.   Then the aorta was crossclamped and 500 mL of cold blood antegrade  cardioplegia was administered in the aortic root with quick arrest of the  heart.  Systemic hypothermia to 20 degrees centigrade and topical  hypothermia with iced saline was used.  A temperature probe was placed on  the septum and insulating pad in the pericardium.   The first distal anastomosis was performed to the obtuse marginal branch.  The internal diameter was about 2.5 mm.  The conduit used was a segment of  greater saphenous vein.  The anastomosis was performed in an end-to-side manner using continuous 7-0 Prolene suture.  Flow was measured  through the  graft and was excellent.  Then another dose of cardioplegia was given down  the vein graft and in the aortic root.   The second distal anastomosis was performed to the diagonal branch.  The  internal diameter was about 1.5 mm.  The conduit used was a second segment  of greater saphenous vein and the anastomosis performed in an end-to-side  manner using continuous 7-0 Prolene suture.  Flow  was measured through the  graft and was excellent.   Then the third distal anastomosis was performed to the distal portion of the  left anterior descending coronary artery.  The internal diameter was about  1.75 mm.  The conduit used was the free right internal mammary artery graft  and this was anastomosed in an end-to-side manner using continuous 8-0  Prolene suture.  The pedicle was tacked to epicardium with 6-0 Prolene  sutures.  The patient was rewarmed to 37 degrees centigrade.  With the cross-  clamp in place, the two proximal vein graft anastomoses were performed in  the aortic root in end-to-side manner using continuous 6-0 Prolene suture.  The proximal anastomosis of the internal mammary artery graft was performed  at the hood of the obtuse marginal vein graft in end-to-side manner using  continuous 7-0 Prolene suture.  Then the crossclamp was removed with time of  59 minutes.  There was spontaneous return of sinus rhythm.  The proximal and  distal anastomosis appeared hemostatic and line of the graft satisfactory.  Graft markers placed on the proximal anastomoses.  Two temporary right  ventricular and right atrial pacing wires placed and brought out through the  skin.   DICTATION ENDED HERE.                                               Evelene Croon, M.D.    BB/MEDQ  D:  09/13/2003  T:  09/13/2003  Job:  [id number]

## 2010-11-29 NOTE — Consult Note (Signed)
NAMEPETULA, ROTOLO                ACCOUNT NO.:  [id number]   MEDICAL RECORD NO.:  [id number]          PATIENT TYPE:  INP   LOCATION:  NA                           FACILITY:  [facility]   PHYSICIAN:  Marin Roberts, MDDATE OF BIRTH:  1955-10-29   DATE OF CONSULTATION:  11/08/2008  DATE OF DISCHARGE:                                 CONSULTATION   REFERRING PHYSICIAN:  Vonna Kotyk R. Jacinto Halim, MD   REASON FOR CONSULTATION:  Intracranial interpretation of cerebral  arteriogram.   FINDINGS:  Right common carotid injection.  There is a high-grade  stenosis of the distal cavernous right internal carotid artery with  poststenotic dilation.  There is no antegrade flow within the A1 segment  as a result of this.  No focal aneurysm is seen.  There is delayed  filling of the MCA branch vessels.  The dural sinuses fill normally.   Left common carotid injection:  There is a small downward outpouching of  the supraclinoid left internal carotid artery measuring 1-2 mm.  Mild  smooth narrowing of the proximal left A1 segment is less than 50%.  There is a patent anterior communicating artery with some retrograde  filling of the right A1 segment.  Both A2 segments fill from this  injection.   Left vertebral artery injection.  There is a focal wind sock outpouching  seen at the level of the right PICA.  This is seen with retrograde flow  in the distal right vertebral artery.  There is also a double shadow  seen at the basilar tip, suggesting small basilar tip aneurysm.  These  lesions are incompletely imaged with these views.   IMPRESSIONS:  1. 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  2. Small outpouching along the left supraclinoid internal carotid      artery.  3. High-grade stenosis of the right distal cavernous and internal      carotid artery with poststenotic dilation  and delayed flow to the      right MCA branch vessels.  4. Filling of the right ACA via the left-sided injection by a patent      anterior communicating artery.      Marin Roberts, MD  Electronically Signed     CM/MEDQ  D:  11/08/2008  T:  11/09/2008  Job:  [id number]

## 2010-11-29 NOTE — Consult Note (Signed)
[name], SMYRE                          ACCOUNT NO.:  [id number]   MEDICAL RECORD NO.:  [id number]                   PATIENT TYPE:  OIB   LOCATION:  2931                                 FACILITY:  [facility]   PHYSICIAN:  Evelene Croon, M.D.                  DATE OF BIRTH:  April 15, 1956   DATE OF CONSULTATION:  09/12/2003  DATE OF DISCHARGE:                                   CONSULTATION   REFERRING PHYSICIAN:  Darlin Priestly, M.D.   REASON FOR CONSULTATION:  Occluded left main and severe two-vessel coronary  artery disease.   HISTORY OF PRESENT ILLNESS:  This patient is a 55 year old woman from [place] whose husband is a Engineer, civil (consulting) in the transitional care unit. She has  long history of diffuse vascular disease involving the abdominal aorta,  renal arteries, lower extremity vasculature, and cerebrovasculature. She  apparently recently underwent a carotid ultrasound which showed a high grade  left internal carotid artery stenosis. She had cerebral angiography, but it  was felt that a Cardiolite stress test was indicated to rule out silent  ischemia since she had no cardiac symptoms. This showed a large reversible  anteroapical ischemic defect. Ejection fraction was about 54%. She underwent  cardiac catheterization today by Dr. Jenne Campus which showed an occluded left  main coronary artery. The LAD had a 99% proximal stenosis for a diagonal  branch. The LAD was occluded after the diagonal branch with filling of the  distal vessel by collaterals from a large dominant right coronary artery.  Left circumflex was occluded proximally with filling of a large marginal by  right and left collaterals. Left ventricular ejection fraction about 45 to  50% with mild global hypokinesis. There is diffuse calcific disease of the  left subclavian artery. The IMI was not visualized. There is also diffuse  abdominal aortic disease with 50 to 60% infrarenal aortic stenosis and a  small aneurysm.  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 There was no gradient across the aortic valve. There  was no mitral regurgitation.   PAST MEDICAL HISTORY:  1. Significant for hypertension since age 42.  2. She has a history of hyperlipidemia.  3. She has history of diffuse vascular disease. 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 She has a known infrarenal abdominal aortic stenosis. There is     known 90% left profunda artery stenosis and 90% right SFA occlusion. She     has been followed by Dr. Madilyn Fireman for her peripheral vascular disease. She     has had no prior surgery.   MEDICATIONS PRIOR TO ADMISSION:  1. Aspirin 325 mg q.d.  2. Toprol-XL 100 mg q.d.  3. Norvasc 10 mg q.d.  4. Lipitor 20 mg q.d.  5. Catapres TTS-2 patch.  6. Micardis/hydrochlorothiazide 40/12.5 mg q.d.   ALLERGIES:  None.   REVIEW OF SYSTEMS:  GENERAL:  She has had no recent weight changes. She  denies any fevers or chills. She has had some fatigue since working since  November. She attributes this to starting to work again and it usually  occurs late in the date after getting home from work. EYES:  She does have  some visual changes in her right eye at times noting some flashing sensation  as well as a dark line going across her vision. This usually resolves with  rest. ENT:  Negative. ENDOCRINE:  She denies diabetes and hypothyroidism.  CARDIOVASCULAR:  She denies any chest pain or pressure. She has had no  shortness of breath. She denies PND or orthopnea. She has had no  palpitations. RESPIRATORY:  She denies cough and sputum production.  GASTROINTESTINAL:  She has had no nausea or vomiting. She denies melena or  bright red blood per rectum. GENITOURINARY:  She has had no dysuria or  hematuria. NEUROLOGICAL:  She denies any focal weakness or numbness. She has  had occasional episodes of dizziness. She  denies any history of TIA or  stroke. HEMATOLOGICAL:  She denies any history of bleeding disorders or easy  bleeding. PSYCHIATRIC:  Negative.   FAMILY HISTORY:  Family history is positive for cardiovascular disease. Her  mother, father, and brother have had strokes.   SOCIAL HISTORY:  She is married to Antionette Poles who is a Engineer, civil (consulting) in the TCU at  [employer]. She works at the [employer] and [employer].  She has never smoked and denies alcohol abuse.   PHYSICAL EXAMINATION:  VITAL SIGNS:  Her blood pressure is 102/60 and her  pulse is 62 and regular. Respiratory rate is 16 and unlabored. Her weight is  168 pounds. She is 5 foot 3 inches. She is a well-developed, [place]  woman in no distress.  HEENT:  Shows to be normocephalic, atraumatic. Pupils are equal and reactive  to light and accommodation. Extraocular muscles are intact. Her throat is  clear.  NECK:  Shows normal carotid pulses bilaterally. There is left cervical  bruit.  CARDIAC:  Regular rate and rhythm with normal S1 and S2. There are no  murmurs, rubs, or gallops.  LUNGS:  Clear.  ABDOMEN:  Shows active bowel sounds. Her abdomen is soft and nontender.  There are no palpable masses or organomegaly.  EXTREMITIES:  Shows no peripheral edema. Pedal pulses are palpable  bilaterally.  SKIN:  Her skin is warm and dry.  NEUROLOGICAL:  Shows her to be alert and oriented x3. Motor and sensory  exams were normal.   LABORATORY DATA:  Shows hemoglobin of 13.1, platelet count of 301,000.  Coagulation profile was normal. BUN of 25 and creatinine of 1.0. Her albumin  was 4.3. Liver function profile was normal. LDL was 93 with a HDL of 41,  triglycerides of 123. Electrocardiogram shows normal sinus rhythm with no  acute changes.   IMPRESSION:  This patient has an essentially occluded left main coronary  artery and severe two-vessel coronary artery disease with her left coronary circulation supplied by collaterals  and a large dominant right coronary  artery. She has a large reversible anterior apical defect on her Cardiolite  scan. Although she is asymptomatic, I agree that she is at high risk for  further ischemia and infarction and coronary artery bypass graft surgery is  indicated to prevent  this. I discussed her case with Dr. Jerilee Field  concerning her left carotid stenosis. We repeated her carotid Doppler  examination in the hospital, and this shows a roughly 70% left internal  carotid artery stenosis. Dr. Hart Rochester reviewed the study with the vascular lab  and did not feel that concomitant left carotid endarterectomy was indicated  in this patient. I discussed the operative procedure of coronary artery  bypass surgery with the patient and her husband including alternatives,  benefits, and risks including bleeding, blood transfusion, infection,  stroke, myocardial infarction, renal failure. They understand and agree to  proceed with surgery. We will plan to do this on September 13, 2003.                                               Evelene Croon, M.D.    BB/MEDQ  D:  09/12/2003  T:  09/13/2003  Job:  [id number]

## 2010-11-29 NOTE — Cardiovascular Report (Signed)
[name], [name]                          ACCOUNT NO.:  [id number]   MEDICAL RECORD NO.:  [id number]                   PATIENT TYPE:  INP   LOCATION:  2307                                 FACILITY:  [facility]   PHYSICIAN:  Darlin Priestly, M.D.             DATE OF BIRTH:  09-23-1955   DATE OF PROCEDURE:  09/12/2003  DATE OF DISCHARGE:                              CARDIAC CATHETERIZATION   PROCEDURES:  1. Left heart catheterization.  2. Coronary angiography.  3. Left ventriculogram.  4. Left subclavian angiography.  5. Abdominal aortogram.   CARDIOLOGIST:  Darlin Priestly, M.D.   COMPLICATIONS:  None.   INDICATIONS:  [name] is a 55 year old female with a history of severe  hypertension, history of severe peripheral vascular disease, with 70-80%  left carotid stenosis, history of total right renal artery occlusion,  history of 50% infrarenal aorto-ostial lesion and a 90% left profundus  lesion with a 90% right SFA occlusion.  She recently was scheduled to  undergo a carotid angiogram and in preparation for this underwent a  Persantine Cardiolite suggesting anterior wall ischemia.  She is now  referred for cardiac catheterization to define her coronary anatomy.   DESCRIPTION OF PROCEDURE:  After obtaining informed written consent, the  patient was brought to the cardiac catheterization lab where her right and  left groins were shaved, prepped, and draped in the usual sterile fashion.  ECG monitoring was established.  Using modified Seldinger technique, a #6  French arterial sheath was threaded through the left femoral artery.  The 6  French diagnostic catheters were then used to perform diagnostic  angiography.  This reveals a calcified and diffusely diseased left main of  99%.  The LAD is subtotally occluded in its proximal portion and does feel  one small diagonal branch which appears to be diffusely diseased in its  proximal segment.  The left circumflex is totally  occluded in its proximal  segment.   The right coronary artery is a large vessel which is dominant.  It gives  rise to the PDA as well as the posterior lateral branch.  The RCA is  irregular but has no high grade stenosis.  There are multiple right to left  collaterals to the distal circumflex as well as LAD, retrograde filling the  LAD, diagonal septal perforator as well as two obtuse marginal branches.  The LAD appears to be a moderate size vessel at the distal portion as well  as a moderate size diagonal.   The left circumflex appears to be a large vessel with large bifurcating  obtuse marginal.   LEFT VENTRICULOGRAM:  The left ventriculogram reveals mildly ejection  fraction of 45-50% with mild global hypokinesis.   The left subclavian appears to be calcified and diffusely diseased and the  IMA is not visualized.   Abdominal aortogram reveals diffuse severe disease of the intrarenal  abdominal aorta.  The right  renal appears to be totally occluded.  The left  renal appears to be patent with 30% left renal artery stenosis.  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   HEMODYNAMIC DATA:  1. Systemic arterial pressure 142/79.  2. LV systemic pressure 146/11.  3. LVEDP of 15.   CONCLUSION:  1. Significant left main and two vessel coronary artery disease.  2. Mildly depressed left ventricular function systolic function.  3. Diffusely diseased and calcified left subclavian.  4. Diffuse disease of the distal aorta with a 50-60% infrarenal stenosis and     small distal aneurysm.  5. Renal artery stenosis of 30%, totally occluded right renal artery.  6. Systemic hypertension.                                               Darlin Priestly, M.D.    RHM/MEDQ  D:  09/12/2003  T:  09/13/2003  Job:  [id number]

## 2010-11-29 NOTE — Op Note (Signed)
NAMEADRIENE, [name]                          ACCOUNT NO.:  [id number]   MEDICAL RECORD NO.:  [id number]                   PATIENT TYPE:  INP   LOCATION:  2307                                 FACILITY:  [facility]   PHYSICIAN:  Evelene Croon, M.D.                  DATE OF BIRTH:  Nov 08, 1955   DATE OF PROCEDURE:  09/13/2003  DATE OF DISCHARGE:                                 OPERATIVE REPORT   CONTINUATION   When the patient had rewarmed to 37 degrees centigrade, she was weaned from  cardiopulmonary bypass on no inotropic agents.  Total bypass time was 85  minutes.  Cardiac function appeared excellent with a cardiac output of 6L  per minute.  Protamine was given and the venous and aortic cannulas were  removed without difficulty.  Hemostasis was achieved.  The patient was given  10 units of platelets since she was coagulopathic and had been on aspirin  and Plavix preoperatively.  Four chest tubes were placed with a bilateral  pleural tubes, a tube in the posterior pericardium, and one in the anterior  mediastinum.  The pericardium was loosely approximated over the heart.  The  sternum was closed with #6 stainless steel wires.  The fascia was closed  with continuous #1 Vicryl suture.  The subcutaneous tissue was closed with  continuous 2-0 Vicryl and the skin with 3-0 Vicryl subcuticular closure.  The lower extremity vein harvest site was closed in layers in a similar  manner.  Sponge, needle and instrument counts were correct according to the  scrub nurse.  Dry sterile dressings were applied over the incisions, around  the chest tubes which were hooked to Pleur-evac suction.  The patient  remained hemodynamically stable and was transported to the SICU in guarded  but stable condition.                                               Evelene Croon, M.D.    BB/MEDQ  D:  09/13/2003  T:  09/13/2003  Job:  [id number]   cc:   Darlin Priestly, M.D.  [address]  [place]  [place]  [address]  Fax: [phone]

## 2011-04-03 ENCOUNTER — Encounter: Payer: Self-pay | Admitting: Cardiovascular Disease

## 2011-04-04 LAB — BASIC METABOLIC PANEL
BUN: 19
Calcium: 8.7
GFR calc non Af Amer: >60
Glucose, Bld: 164 — ABNORMAL HIGH
Sodium: 137

## 2011-04-04 LAB — CBC
Hemoglobin: 12.1
Platelets: 244
RDW: 13

## 2011-08-20 ENCOUNTER — Encounter: Payer: Self-pay | Admitting: Cardiovascular Disease

## 2012-02-20 ENCOUNTER — Ambulatory Visit
Admission: RE | Admit: 2012-02-20 | Discharge: 2012-02-20 | Disposition: A | Discharge status: Home / Self Care | Payer: No Typology Code available for payment source | Source: Ambulatory Visit | Attending: Infectious Diseases | Admitting: Infectious Diseases

## 2012-02-20 ENCOUNTER — Other Ambulatory Visit: Payer: Self-pay | Admitting: Infectious Diseases

## 2012-02-20 DIAGNOSIS — R7612 Nonspecific reaction to cell mediated immunity measurement of gamma interferon antigen response without active tuberculosis: Secondary | ICD-10-CM

## 2012-03-08 ENCOUNTER — Other Ambulatory Visit (HOSPITAL_COMMUNITY): Payer: Self-pay | Admitting: Cardiovascular Disease

## 2012-03-08 DIAGNOSIS — I729 Aneurysm of unspecified site: Secondary | ICD-10-CM

## 2012-03-18 ENCOUNTER — Ambulatory Visit (HOSPITAL_COMMUNITY)
Admission: RE | Admit: 2012-03-18 | Discharge: 2012-03-18 | Disposition: A | Discharge status: Home / Self Care | Payer: BC Managed Care – PPO | Source: Ambulatory Visit | Attending: Cardiovascular Disease | Admitting: Cardiovascular Disease

## 2012-03-18 DIAGNOSIS — I712 Thoracic aortic aneurysm, without rupture, unspecified: Secondary | ICD-10-CM | POA: Insufficient documentation

## 2012-03-18 DIAGNOSIS — I729 Aneurysm of unspecified site: Secondary | ICD-10-CM

## 2012-03-18 MED ORDER — DIPHENHYDRAMINE HCL 50 MG/ML IJ SOLN
INTRAMUSCULAR | Status: AC
  Administered 2012-03-18: 50 mg via INTRAVENOUS
  Filled 2012-03-18: qty 1

## 2012-03-18 MED ORDER — METHYLPREDNISOLONE SODIUM SUCC 125 MG IJ SOLR
125.0000 mg | Freq: Once | INTRAMUSCULAR | Status: DC
  Filled 2012-03-18: qty 2

## 2012-03-18 MED ORDER — DIPHENHYDRAMINE HCL 50 MG/ML IJ SOLN
50.0000 mg | Freq: Once | INTRAMUSCULAR | Status: DC
  Filled 2012-03-18: qty 1

## 2012-03-18 MED ORDER — IOHEXOL 300 MG/ML  SOLN
100.0000 mL | Freq: Once | INTRAMUSCULAR | Status: AC | PRN
  Administered 2012-03-18: 100 mL via INTRAVENOUS

## 2012-03-18 MED ORDER — METHYLPREDNISOLONE SODIUM SUCC 125 MG IJ SOLR
INTRAMUSCULAR | Status: AC
  Administered 2012-03-18: 125 mg via INTRAVENOUS
  Filled 2012-03-18: qty 2

## 2012-03-24 ENCOUNTER — Other Ambulatory Visit: Payer: Self-pay | Admitting: Internal Medicine

## 2012-03-24 DIAGNOSIS — Z1231 Encounter for screening mammogram for malignant neoplasm of breast: Secondary | ICD-10-CM

## 2012-04-14 ENCOUNTER — Ambulatory Visit: Payer: BC Managed Care – PPO

## 2012-04-19 ENCOUNTER — Encounter: Payer: Self-pay | Admitting: Cardiovascular Disease

## 2012-07-22 ENCOUNTER — Ambulatory Visit
Admission: RE | Admit: 2012-07-22 | Discharge: 2012-07-22 | Disposition: A | Discharge status: Home / Self Care | Payer: 59 | Source: Ambulatory Visit | Attending: Internal Medicine | Admitting: Internal Medicine

## 2012-07-22 DIAGNOSIS — Z1231 Encounter for screening mammogram for malignant neoplasm of breast: Secondary | ICD-10-CM

## 2013-01-20 ENCOUNTER — Other Ambulatory Visit: Payer: Self-pay | Admitting: *Deleted

## 2013-01-20 MED ORDER — AMLODIPINE BESYLATE 5 MG PO TABS: 5.0000 mg | ORAL_TABLET | Freq: Every day | ORAL | Status: DC

## 2013-01-20 NOTE — Telephone Encounter (Signed)
She needs refill for amlodipine #90 supply sent [facility].

## 2013-04-06 ENCOUNTER — Other Ambulatory Visit: Payer: Self-pay | Admitting: *Deleted

## 2013-04-06 MED ORDER — LOSARTAN POTASSIUM 100 MG PO TABS: 100.0000 mg | ORAL_TABLET | Freq: Every day | ORAL | Status: DC

## 2013-04-06 MED ORDER — AMLODIPINE BESYLATE 5 MG PO TABS: 5.0000 mg | ORAL_TABLET | Freq: Every day | ORAL | Status: DC

## 2013-04-06 MED ORDER — NIACIN ER (ANTIHYPERLIPIDEMIC) 1000 MG PO TBCR: 1000.0000 mg | EXTENDED_RELEASE_TABLET | Freq: Every day | ORAL | Status: DC

## 2013-04-06 MED ORDER — CLOPIDOGREL BISULFATE 75 MG PO TABS: 75.0000 mg | ORAL_TABLET | Freq: Every day | ORAL | Status: DC

## 2013-04-06 MED ORDER — METOPROLOL SUCCINATE ER 100 MG PO TB24: 100.0000 mg | ORAL_TABLET | Freq: Every day | ORAL | Status: DC

## 2013-04-06 MED ORDER — ROSUVASTATIN CALCIUM 20 MG PO TABS: 20.0000 mg | ORAL_TABLET | Freq: Every day | ORAL | Status: DC

## 2013-04-06 NOTE — Telephone Encounter (Signed)
Pt request prescription and informed her she needs an appoinment Prescriptions give to her to take to [facility]

## 2013-04-06 NOTE — Telephone Encounter (Signed)
rx given to patient to take to [facility]

## 2013-07-27 ENCOUNTER — Ambulatory Visit (HOSPITAL_COMMUNITY): Admission: RE | Admit: 2013-07-27 | Payer: 59 | Source: Ambulatory Visit

## 2013-07-27 ENCOUNTER — Encounter: Payer: Self-pay | Admitting: Internal Medicine

## 2013-07-27 ENCOUNTER — Other Ambulatory Visit (HOSPITAL_COMMUNITY): Payer: Self-pay | Admitting: Internal Medicine

## 2013-07-27 ENCOUNTER — Ambulatory Visit (HOSPITAL_COMMUNITY): Payer: 59

## 2013-07-27 DIAGNOSIS — I712 Thoracic aortic aneurysm, without rupture, unspecified: Secondary | ICD-10-CM

## 2013-07-29 ENCOUNTER — Ambulatory Visit (HOSPITAL_COMMUNITY): Payer: 59

## 2013-07-29 ENCOUNTER — Other Ambulatory Visit (HOSPITAL_COMMUNITY): Payer: 59

## 2013-08-03 ENCOUNTER — Ambulatory Visit (HOSPITAL_COMMUNITY)
Admission: RE | Admit: 2013-08-03 | Discharge: 2013-08-03 | Disposition: A | Discharge status: Home / Self Care | Payer: 59 | Source: Ambulatory Visit | Attending: Internal Medicine | Admitting: Internal Medicine

## 2013-08-03 ENCOUNTER — Encounter (HOSPITAL_COMMUNITY): Payer: Self-pay

## 2013-08-03 ENCOUNTER — Ambulatory Visit (HOSPITAL_COMMUNITY): Admission: RE | Admit: 2013-08-03 | Payer: 59 | Source: Ambulatory Visit

## 2013-08-03 DIAGNOSIS — I712 Thoracic aortic aneurysm, without rupture, unspecified: Secondary | ICD-10-CM | POA: Insufficient documentation

## 2013-08-03 DIAGNOSIS — I359 Nonrheumatic aortic valve disorder, unspecified: Secondary | ICD-10-CM | POA: Insufficient documentation

## 2013-08-03 MED ORDER — IOHEXOL 350 MG/ML SOLN
100.0000 mL | Freq: Once | INTRAVENOUS | Status: AC | PRN
  Administered 2013-08-03: 100 mL via INTRAVENOUS

## 2013-09-28 ENCOUNTER — Other Ambulatory Visit: Payer: Self-pay | Admitting: *Deleted

## 2013-09-28 MED ORDER — ROSUVASTATIN CALCIUM 20 MG PO TABS: 20.0000 mg | ORAL_TABLET | Freq: Every day | ORAL | Status: DC

## 2013-09-28 MED ORDER — LOSARTAN POTASSIUM 100 MG PO TABS: 100.0000 mg | ORAL_TABLET | Freq: Every day | ORAL | Status: DC

## 2013-09-28 MED ORDER — METOPROLOL SUCCINATE ER 100 MG PO TB24: 100.0000 mg | ORAL_TABLET | Freq: Every day | ORAL | Status: DC

## 2013-09-29 ENCOUNTER — Other Ambulatory Visit: Payer: Self-pay | Admitting: *Deleted

## 2013-09-29 MED ORDER — CLOPIDOGREL BISULFATE 75 MG PO TABS: 75.0000 mg | ORAL_TABLET | Freq: Every day | ORAL | Status: DC

## 2013-12-14 ENCOUNTER — Other Ambulatory Visit: Payer: Self-pay | Admitting: *Deleted

## 2013-12-14 MED ORDER — LOSARTAN POTASSIUM 100 MG PO TABS: 100.0000 mg | ORAL_TABLET | Freq: Every day | ORAL | Status: DC

## 2014-04-18 ENCOUNTER — Other Ambulatory Visit: Payer: Self-pay | Admitting: *Deleted

## 2014-04-18 MED ORDER — AMLODIPINE BESYLATE 5 MG PO TABS: 5.0000 mg | ORAL_TABLET | Freq: Every day | ORAL | Status: DC

## 2014-04-18 NOTE — Telephone Encounter (Signed)
Patient requested a refill for Norvasc.  RX handed to patient.

## 2014-08-11 ENCOUNTER — Other Ambulatory Visit: Payer: Self-pay | Admitting: *Deleted

## 2014-08-11 ENCOUNTER — Encounter: Payer: Self-pay | Admitting: *Deleted

## 2014-08-11 ENCOUNTER — Encounter (HOSPITAL_COMMUNITY): Payer: Self-pay | Admitting: *Deleted

## 2014-08-11 DIAGNOSIS — I739 Peripheral vascular disease, unspecified: Secondary | ICD-10-CM

## 2014-09-19 ENCOUNTER — Ambulatory Visit (HOSPITAL_COMMUNITY)
Admission: RE | Admit: 2014-09-19 | Discharge: 2014-09-19 | Disposition: A | Discharge status: Home / Self Care | Payer: 59 | Source: Ambulatory Visit | Attending: Cardiology | Admitting: Cardiology

## 2014-09-19 DIAGNOSIS — I739 Peripheral vascular disease, unspecified: Secondary | ICD-10-CM

## 2014-09-19 DIAGNOSIS — I701 Atherosclerosis of renal artery: Secondary | ICD-10-CM | POA: Diagnosis not present

## 2014-09-19 DIAGNOSIS — N289 Disorder of kidney and ureter, unspecified: Secondary | ICD-10-CM | POA: Insufficient documentation

## 2014-09-19 DIAGNOSIS — I6523 Occlusion and stenosis of bilateral carotid arteries: Secondary | ICD-10-CM | POA: Diagnosis not present

## 2014-09-19 NOTE — Progress Notes (Signed)
Renal Duplex Completed. Mersedes Alber, BS, RDMS, RVT  

## 2014-09-19 NOTE — Progress Notes (Signed)
Carotid Duplex Completed. °Brianna L Mazza,RVT °

## 2014-09-21 ENCOUNTER — Other Ambulatory Visit: Payer: Self-pay | Admitting: *Deleted

## 2014-09-21 MED ORDER — AMLODIPINE BESYLATE 5 MG PO TABS: 5.0000 mg | ORAL_TABLET | Freq: Every day | ORAL | Status: DC

## 2014-09-21 NOTE — Telephone Encounter (Signed)
Request prescription for norvasc  to be E-sent to pharmacy. RN e-sent medication

## 2014-09-26 ENCOUNTER — Encounter: Payer: Self-pay | Admitting: *Deleted

## 2014-10-03 ENCOUNTER — Ambulatory Visit: Payer: 59 | Admitting: Cardiovascular Disease

## 2014-10-18 ENCOUNTER — Other Ambulatory Visit: Payer: Self-pay | Admitting: *Deleted

## 2014-10-18 MED ORDER — AMLODIPINE BESYLATE 5 MG PO TABS: 5.0000 mg | ORAL_TABLET | Freq: Every day | ORAL | Status: DC

## 2014-10-18 MED ORDER — METOPROLOL SUCCINATE ER 100 MG PO TB24: 100.0000 mg | ORAL_TABLET | Freq: Every day | ORAL | Status: DC

## 2014-10-18 MED ORDER — NIACIN ER (ANTIHYPERLIPIDEMIC) 1000 MG PO TBCR: 1000.0000 mg | EXTENDED_RELEASE_TABLET | Freq: Every day | ORAL | Status: DC

## 2014-10-18 MED ORDER — CLOPIDOGREL BISULFATE 75 MG PO TABS: 75.0000 mg | ORAL_TABLET | Freq: Every day | ORAL | Status: DC

## 2014-10-18 MED ORDER — LOSARTAN POTASSIUM 100 MG PO TABS: 100.0000 mg | ORAL_TABLET | Freq: Every day | ORAL | Status: DC

## 2014-10-18 MED ORDER — ROSUVASTATIN CALCIUM 20 MG PO TABS: 20.0000 mg | ORAL_TABLET | Freq: Every day | ORAL | Status: DC

## 2014-11-03 ENCOUNTER — Ambulatory Visit: Payer: 59 | Admitting: Cardiovascular Disease

## 2014-11-07 ENCOUNTER — Telehealth: Payer: Self-pay | Admitting: *Deleted

## 2014-11-07 MED ORDER — LOSARTAN POTASSIUM 100 MG PO TABS: 100.0000 mg | ORAL_TABLET | Freq: Every day | ORAL | Status: DC

## 2014-11-07 MED ORDER — CLOPIDOGREL BISULFATE 75 MG PO TABS: 75.0000 mg | ORAL_TABLET | Freq: Every day | ORAL | Status: DC

## 2014-11-07 NOTE — Telephone Encounter (Signed)
patient wanted prescription sent to pharmacy. [facility]. Paientt aware

## 2014-12-14 ENCOUNTER — Encounter: Payer: Self-pay | Admitting: Cardiovascular Disease

## 2014-12-15 ENCOUNTER — Ambulatory Visit (INDEPENDENT_AMBULATORY_CARE_PROVIDER_SITE_OTHER): Payer: 59 | Admitting: Cardiovascular Disease

## 2014-12-15 ENCOUNTER — Encounter: Payer: Self-pay | Admitting: Cardiovascular Disease

## 2014-12-15 VITALS — BP 158/90 | HR 64 | Ht 62.0 in | Wt 206.8 lb

## 2014-12-15 DIAGNOSIS — I10 Essential (primary) hypertension: Secondary | ICD-10-CM | POA: Insufficient documentation

## 2014-12-15 DIAGNOSIS — I2510 Atherosclerotic heart disease of native coronary artery without angina pectoris: Secondary | ICD-10-CM | POA: Insufficient documentation

## 2014-12-15 DIAGNOSIS — E785 Hyperlipidemia, unspecified: Secondary | ICD-10-CM | POA: Insufficient documentation

## 2014-12-15 DIAGNOSIS — N2889 Other specified disorders of kidney and ureter: Secondary | ICD-10-CM | POA: Insufficient documentation

## 2014-12-15 DIAGNOSIS — I2583 Coronary atherosclerosis due to lipid rich plaque: Secondary | ICD-10-CM

## 2014-12-15 DIAGNOSIS — I779 Disorder of arteries and arterioles, unspecified: Secondary | ICD-10-CM | POA: Insufficient documentation

## 2014-12-15 DIAGNOSIS — I739 Peripheral vascular disease, unspecified: Secondary | ICD-10-CM

## 2014-12-15 MED ORDER — LOSARTAN POTASSIUM 100 MG PO TABS: 100.0000 mg | ORAL_TABLET | Freq: Every day | ORAL | Status: DC

## 2014-12-15 MED ORDER — ROSUVASTATIN CALCIUM 20 MG PO TABS
20.0000 mg | ORAL_TABLET | Freq: Every day | ORAL | Status: DC
Start: 2014-12-15 — End: 2015-10-18

## 2014-12-15 MED ORDER — NIACIN ER (ANTIHYPERLIPIDEMIC) 1000 MG PO TBCR: 1000.0000 mg | EXTENDED_RELEASE_TABLET | Freq: Every day | ORAL | Status: DC

## 2014-12-15 MED ORDER — CLOPIDOGREL BISULFATE 75 MG PO TABS: 75.0000 mg | ORAL_TABLET | Freq: Every day | ORAL | Status: DC

## 2014-12-15 MED ORDER — AMLODIPINE BESYLATE 5 MG PO TABS: 5.0000 mg | ORAL_TABLET | Freq: Every day | ORAL | Status: DC

## 2014-12-15 MED ORDER — METOPROLOL SUCCINATE ER 100 MG PO TB24: 100.0000 mg | ORAL_TABLET | Freq: Every day | ORAL | Status: DC

## 2014-12-15 NOTE — Assessment & Plan Note (Signed)
History of coronary artery disease status post coronary artery bypass grafting March 2005 with a LIMA to LAD, vein to the circumflex and diagonal branch. She denies chest pain or shortness of breath. Her last Myoview performed 04/03/11 was low risk with breast attenuation artifact.

## 2014-12-15 NOTE — Assessment & Plan Note (Signed)
History of carotid artery disease status post stenting of her intracranial right internal carotid artery by Dr. Titus Dubinevashwar in the past. Her last carotid Doppler study performed 09/19/14 revealed moderately severe left ICA stenosis unchanged from her prior Doppler performed 3 years ago.

## 2014-12-15 NOTE — Assessment & Plan Note (Signed)
History of hypertension blood pressure measured at 158/90. She is on losartan and metoprolol. She admits to not taking her medicines as directed recently.

## 2014-12-15 NOTE — Assessment & Plan Note (Signed)
History of hyperlipidemia on rosuvastatin and niacin with recent lipid profile performed earlier this year by her primary care physician revealing a total cholesterol 182, LDL 102 and HDL of 56.

## 2014-12-15 NOTE — Progress Notes (Signed)
12/15/2014 [name]   04-04-1956  [id number]  Primary Physician Alysia PennaHOLWERDA, SCOTT, MD Primary Cardiologist: Runell GessJonathan J. Emilyann Banka MD Roseanne RenoFACP,FACC,FAHA, FSCAI   HPI:  [name] is a 59 year old female who I last saw in the office July 2013. She has a history of CAD status post CABG March 2005 with a LIMA to LAD, vein graft to the circumflex and diagonal branch. She has a known occluded right renal artery status post left renal artery stenting 12/25/08 with a 5.5 mm x 16 mm long Herculink balloon expandable stent. She's had right intracranial intracarotid intervention by Dr. Titus Dubinevashwar in the past. For the problems include hypertension and hyperlipidemia. She denies chest pain or shortness of breath. Her most recent carotid Dopplers performed 3 months ago showed stable moderately severe left ICA stenosis and renal Dopplers performed at the same time showed stable moderate "in-stent restenosis" within the left renal artery stent.   Current Outpatient Prescriptions  Medication Sig Dispense Refill  . amLODipine (NORVASC) 5 MG tablet Take 1 tablet (5 mg total) by mouth daily. 90 tablet 3  . clopidogrel (PLAVIX) 75 MG tablet Take 1 tablet (75 mg total) by mouth daily. 90 tablet 3  . losartan (COZAAR) 100 MG tablet Take 1 tablet (100 mg total) by mouth daily. 30 tablet 3  . metoprolol succinate (TOPROL-XL) 100 MG 24 hr tablet Take 1 tablet (100 mg total) by mouth daily. Take with or immediately following a meal. 90 tablet 3  . niacin (NIASPAN) 1000 MG CR tablet Take 1 tablet (1,000 mg total) by mouth at bedtime. 90 tablet 3  . rosuvastatin (CRESTOR) 20 MG tablet Take 1 tablet (20 mg total) by mouth daily. 90 tablet 3   No current facility-administered medications for this visit.    Allergies  Allergen Reactions  . Contrast Media [Iodinated Diagnostic Agents] Itching    History   Social History  . Marital Status: Married    Spouse Name: N/A  . Number of Children: N/A  . Years of Education:  N/A   Occupational History  . Not on file.   Social History Main Topics  . Smoking status: Never Smoker   . Smokeless tobacco: Not on file  . Alcohol Use: Not on file  . Drug Use: Not on file  . Sexual Activity: Not on file   Other Topics Concern  . Not on file   Social History Narrative     Review of Systems: General: negative for chills, fever, night sweats or weight changes.  Cardiovascular: negative for chest pain, dyspnea on exertion, edema, orthopnea, palpitations, paroxysmal nocturnal dyspnea or shortness of breath Dermatological: negative for rash Respiratory: negative for cough or wheezing Urologic: negative for hematuria Abdominal: negative for nausea, vomiting, diarrhea, bright red blood per rectum, melena, or hematemesis Neurologic: negative for visual changes, syncope, or dizziness All other systems reviewed and are otherwise negative except as noted above.    Blood pressure 158/90, pulse 64, height 5\' 2"  (1.575 m), weight 206 lb 12.8 oz (93.804 kg).  General appearance: alert and no distress Neck: no adenopathy, no JVD, supple, symmetrical, trachea midline, thyroid not enlarged, symmetric, no tenderness/mass/nodules and left carotid bruit Lungs: clear to auscultation bilaterally Heart: regular rate and rhythm, S1, S2 normal, no murmur, click, rub or gallop Extremities: extremities normal, atraumatic, no cyanosis or edema  EKG normal sinus rhythm of 64 without ST or T-wave changes. I personally reviewed this EKG  ASSESSMENT AND PLAN:   Unilateral carotid artery disease History  of carotid artery disease status post stenting of her intracranial right internal carotid artery by Dr. Titus Dubin in the past. Her last carotid Doppler study performed 09/19/14 revealed moderately severe left ICA stenosis unchanged from her prior Doppler performed 3 years ago.   Renal vascular disease History of known occlusion of the right renal artery demonstrated angiographically  12/25/08 status post stenting of the left renal artery with a 5 x 5 mm x 15 L long Herculink balloon expandable stent. Her last renal Doppler study performed 09/19/14 showed stable "in-stent restenosis" within the left renal artery stent and stable renal dimensions.   Hyperlipidemia History of hyperlipidemia on rosuvastatin and niacin with recent lipid profile performed earlier this year by her primary care physician revealing a total cholesterol 182, LDL 102 and HDL of 56.   Essential hypertension History of hypertension blood pressure measured at 158/90. She is on losartan and metoprolol. She admits to not taking her medicines as directed recently.   Coronary artery disease History of coronary artery disease status post coronary artery bypass grafting March 2005 with a LIMA to LAD, vein to the circumflex and diagonal branch. She denies chest pain or shortness of breath. Her last Myoview performed 04/03/11 was low risk with breast attenuation artifact.       Runell Gess MD FACP,FACC,FAHA, [facility] 12/15/2014 12:32 PM

## 2014-12-15 NOTE — Assessment & Plan Note (Signed)
History of known occlusion of the right renal artery demonstrated angiographically 12/25/08 status post stenting of the left renal artery with a 5 x 5 mm x 15 L long Herculink balloon expandable stent. Her last renal Doppler study performed 09/19/14 showed stable "in-stent restenosis" within the left renal artery stent and stable renal dimensions.

## 2014-12-15 NOTE — Patient Instructions (Signed)
Your physician wants you to follow-up in: 1 year with Dr Berry. You will receive a reminder letter in the mail two months in advance. If you don't receive a letter, please call our office to schedule the follow-up appointment.  

## 2015-01-01 ENCOUNTER — Telehealth: Payer: Self-pay | Admitting: *Deleted

## 2015-01-01 ENCOUNTER — Other Ambulatory Visit: Payer: Self-pay | Admitting: *Deleted

## 2015-01-01 DIAGNOSIS — L237 Allergic contact dermatitis due to plants, except food: Secondary | ICD-10-CM

## 2015-01-01 MED ORDER — METHYLPREDNISOLONE 4 MG PO TBPK: ORAL_TABLET | ORAL | Status: DC

## 2015-01-01 NOTE — Telephone Encounter (Signed)
Pt. Seen by Dr. Royann Shivers and prescribed a medrol dose pack

## 2015-01-01 NOTE — Telephone Encounter (Signed)
Pt. In office and c/o itching and redness on her neck and arms. Dr. Royann Shivers examined pt. And prescribed a prednisone dose pack

## 2015-02-11 ENCOUNTER — Other Ambulatory Visit: Payer: Self-pay | Admitting: Cardiovascular Disease

## 2015-02-12 NOTE — Telephone Encounter (Signed)
REFILL 

## 2015-04-26 ENCOUNTER — Other Ambulatory Visit: Payer: Self-pay | Admitting: Cardiovascular Disease

## 2015-04-26 DIAGNOSIS — I701 Atherosclerosis of renal artery: Secondary | ICD-10-CM

## 2015-05-01 ENCOUNTER — Ambulatory Visit (HOSPITAL_COMMUNITY)
Admission: RE | Admit: 2015-05-01 | Discharge: 2015-05-01 | Disposition: A | Discharge status: Home / Self Care | Payer: 59 | Source: Ambulatory Visit | Attending: Cardiovascular Disease | Admitting: Cardiovascular Disease

## 2015-05-01 DIAGNOSIS — E785 Hyperlipidemia, unspecified: Secondary | ICD-10-CM | POA: Diagnosis not present

## 2015-05-01 DIAGNOSIS — I700 Atherosclerosis of aorta: Secondary | ICD-10-CM | POA: Diagnosis not present

## 2015-05-01 DIAGNOSIS — I701 Atherosclerosis of renal artery: Secondary | ICD-10-CM | POA: Insufficient documentation

## 2015-05-01 DIAGNOSIS — I2510 Atherosclerotic heart disease of native coronary artery without angina pectoris: Secondary | ICD-10-CM | POA: Diagnosis not present

## 2015-05-01 DIAGNOSIS — K551 Chronic vascular disorders of intestine: Secondary | ICD-10-CM | POA: Insufficient documentation

## 2015-05-01 DIAGNOSIS — I10 Essential (primary) hypertension: Secondary | ICD-10-CM | POA: Insufficient documentation

## 2015-06-19 ENCOUNTER — Other Ambulatory Visit: Payer: Self-pay

## 2015-06-19 MED ORDER — LOSARTAN POTASSIUM 100 MG PO TABS: ORAL_TABLET | ORAL | Status: DC

## 2015-10-18 ENCOUNTER — Other Ambulatory Visit: Payer: Self-pay

## 2015-10-18 ENCOUNTER — Other Ambulatory Visit: Payer: Self-pay | Admitting: Cardiovascular Disease

## 2015-10-18 DIAGNOSIS — L237 Allergic contact dermatitis due to plants, except food: Secondary | ICD-10-CM

## 2015-10-18 NOTE — Telephone Encounter (Signed)
Rx request sent to pharmacy.  

## 2015-10-18 NOTE — Telephone Encounter (Signed)
Medications called to [facility], per pt request 90 day supply. Pt scheduled for annual f/u appt on 6/6

## 2015-12-04 ENCOUNTER — Other Ambulatory Visit: Payer: Self-pay | Admitting: Cardiology

## 2015-12-04 DIAGNOSIS — F418 Other specified anxiety disorders: Secondary | ICD-10-CM

## 2015-12-04 MED ORDER — ALPRAZOLAM 0.25 MG PO TABS: 0.2500 mg | ORAL_TABLET | Freq: Three times a day (TID) | ORAL | Status: DC | PRN

## 2015-12-18 ENCOUNTER — Ambulatory Visit: Payer: 59 | Admitting: Cardiovascular Disease

## 2016-01-18 ENCOUNTER — Other Ambulatory Visit: Payer: Self-pay | Admitting: Cardiovascular Disease

## 2016-01-21 ENCOUNTER — Other Ambulatory Visit: Payer: Self-pay | Admitting: Cardiovascular Disease

## 2016-02-15 ENCOUNTER — Encounter: Payer: Self-pay | Admitting: Cardiovascular Disease

## 2016-02-15 ENCOUNTER — Ambulatory Visit (INDEPENDENT_AMBULATORY_CARE_PROVIDER_SITE_OTHER): Payer: 59 | Admitting: Cardiovascular Disease

## 2016-02-15 VITALS — BP 148/94 | Ht 62.0 in | Wt 204.1 lb

## 2016-02-15 DIAGNOSIS — E785 Hyperlipidemia, unspecified: Secondary | ICD-10-CM | POA: Diagnosis not present

## 2016-02-15 DIAGNOSIS — Z79899 Other long term (current) drug therapy: Secondary | ICD-10-CM | POA: Diagnosis not present

## 2016-02-15 DIAGNOSIS — I779 Disorder of arteries and arterioles, unspecified: Secondary | ICD-10-CM | POA: Diagnosis not present

## 2016-02-15 DIAGNOSIS — N2889 Other specified disorders of kidney and ureter: Secondary | ICD-10-CM | POA: Diagnosis not present

## 2016-02-15 DIAGNOSIS — I2583 Coronary atherosclerosis due to lipid rich plaque: Secondary | ICD-10-CM

## 2016-02-15 DIAGNOSIS — I739 Peripheral vascular disease, unspecified: Secondary | ICD-10-CM

## 2016-02-15 DIAGNOSIS — I10 Essential (primary) hypertension: Secondary | ICD-10-CM

## 2016-02-15 DIAGNOSIS — I2510 Atherosclerotic heart disease of native coronary artery without angina pectoris: Secondary | ICD-10-CM

## 2016-02-15 NOTE — Progress Notes (Signed)
02/15/2016 [name]   1956/03/22  [id number]  Primary Physician Alysia Penna, MD Primary Cardiologist: Runell Gess MD Roseanne Reno  HPI:  [name] is a 60 year old female who I last saw in the office 12/15/14. She has a history of CAD status post CABG March 2005 with a LIMA to LAD, vein graft to the circumflex and diagonal branch. She has a known occluded right renal artery status post left renal artery stenting 12/25/08 with a 5.5 mm x 16 mm long Herculink balloon expandable stent. She's had right intracranial intracarotid intervention by Dr. Titus Dubin in the past. For the problems include hypertension and hyperlipidemia. She denies chest pain or shortness of breath. Her most recent carotid Dopplers performed 09/19/14 showed stable moderately severe left ICA stenosis and renal Dopplers performed 05/01/15 showed stable moderate "in-stent restenosis" within the left renal artery stent. Unfortunately, her husband JP passed away in December 21, 2022 of this year. She does get occasional atypical chest pain.   Current Outpatient Prescriptions  Medication Sig Dispense Refill  . amLODipine (NORVASC) 5 MG tablet TAKE ONE (1) TABLET BY MOUTH EVERY DAY 90 tablet 0  . clopidogrel (PLAVIX) 75 MG tablet TAKE 1 TABLET BY MOUTH EVERY DAY 90 tablet 0  . losartan (COZAAR) 100 MG tablet TAKE ONE (1) TABLET BY MOUTH EVERY DAY 90 tablet 0  . metoprolol succinate (TOPROL-XL) 100 MG 24 hr tablet TAKE ONE (1) TABLET BY MOUTH EVERY DAY 90 tablet 0  . niacin (NIASPAN) 1000 MG CR tablet Take 1 tablet (1,000 mg total) by mouth at bedtime. 90 tablet 3  . rosuvastatin (CRESTOR) 20 MG tablet TAKE 1 TABLET BY MOUTH EVERY DAY 90 tablet 0   No current facility-administered medications for this visit.     Allergies  Allergen Reactions  . Contrast Media [Iodinated Diagnostic Agents] Itching    Social History   Social History  . Marital status: Widowed    Spouse name: N/A  . Number of children: N/A  .  Years of education: N/A   Occupational History  . Not on file.   Social History Main Topics  . Smoking status: Never Smoker  . Smokeless tobacco: Not on file  . Alcohol use Not on file  . Drug use: Unknown  . Sexual activity: Not on file   Other Topics Concern  . Not on file   Social History Narrative  . No narrative on file     Review of Systems: General: negative for chills, fever, night sweats or weight changes.  Cardiovascular: negative for chest pain, dyspnea on exertion, edema, orthopnea, palpitations, paroxysmal nocturnal dyspnea or shortness of breath Dermatological: negative for rash Respiratory: negative for cough or wheezing Urologic: negative for hematuria Abdominal: negative for nausea, vomiting, diarrhea, bright red blood per rectum, melena, or hematemesis Neurologic: negative for visual changes, syncope, or dizziness All other systems reviewed and are otherwise negative except as noted above.    Blood pressure (!) 148/94, height  (1.575 m), weight 204 lb 2 oz (92.6 kg).  General appearance: alert and no distress Neck: no adenopathy, no JVD, supple, symmetrical, trachea midline, thyroid not enlarged, symmetric, no tenderness/mass/nodules and Bilateral carotid bruits Lungs: clear to auscultation bilaterally Heart: regular rate and rhythm, S1, S2 normal, no murmur, click, rub or gallop Extremities: extremities normal, atraumatic, no cyanosis or edema  EKG sinus bradycardia at 59 with nonspecific ST-T wave changes and septal Q waves. I personally reviewed this EKG  ASSESSMENT AND PLAN:  Coronary artery disease History of coronary artery disease status post coronary artery bypass grafting in 2005 with a LIMA to her LAD, vein graft to the circumflex and diagonal branches. She gets occasional atypical chest pain.  Renal vascular disease History of renal vascular disease with known occluded right renal artery status post stenting of her left renal artery by  myself 12/25/08 with a 5.5 mm x 15 mm long Herculink balloon expandable stent. Her most recent renal Dopplers performed 05/01/15 revealed mild in-stent restenosis with a multiple dimension of 13.3 cm. We will recheck renal Doppler studies in October  Essential hypertension History of hypertensive blood pressure measured 140/94. She is on amlodipine, losartan and metoprolol. Continue current meds at current dosing  Hyperlipidemia History of hyperlipidemia on statin therapy. We will recheck a lipid and liver profile      Runell Gess MD [facility], [facility] 02/15/2016 8:21 AM

## 2016-02-15 NOTE — Assessment & Plan Note (Signed)
History of coronary artery disease status post coronary artery bypass grafting in 2005 with a LIMA to her LAD, vein graft to the circumflex and diagonal branches. She gets occasional atypical chest pain.

## 2016-02-15 NOTE — Assessment & Plan Note (Signed)
History of hyperlipidemia on statin therapy. We will recheck a lipid and liver profile 

## 2016-02-15 NOTE — Assessment & Plan Note (Addendum)
History of hypertensive blood pressure measured 140/94. She is on amlodipine, losartan and metoprolol. Continue current meds at current dosing

## 2016-02-15 NOTE — Patient Instructions (Signed)
Medication Instructions:  Your physician recommends that you continue on your current medications as directed. Please refer to the Current Medication list given to you today.   Labwork: Your physician recommends that you return for lab work AT YOUR EARLIEST CONVENIENCE- You will need to be fasting after midnight.   Testing/Procedures: Your physician has requested that you have a renal artery duplex. During this test, an ultrasound is used to evaluate blood flow to the kidneys. Allow one hour for this exam. Do not eat after midnight the day before and avoid carbonated beverages. Take your medications as you usually do.  SCHEDULE FOR October 2017.  Your physician has requested that you have a carotid duplex. This test is an ultrasound of the carotid arteries in your neck. It looks at blood flow through these arteries that supply the brain with blood. Allow one hour for this exam. There are no restrictions or special instructions.    Follow-Up: Your physician wants you to follow-up in: 12 MONTHS WITH DR Allyson Sabal. You will receive a reminder letter in the mail two months in advance. If you don't receive a letter, please call our office to schedule the follow-up appointment.   If you need a refill on your cardiac medications before your next appointment, please call your pharmacy.

## 2016-02-15 NOTE — Assessment & Plan Note (Signed)
History of renal vascular disease with known occluded right renal artery status post stenting of her left renal artery by myself 12/25/08 with a 5.5 mm x 15 mm long Herculink balloon expandable stent. Her most recent renal Dopplers performed 05/01/15 revealed mild in-stent restenosis with a multiple dimension of 13.3 cm. We will recheck renal Doppler studies in October

## 2016-02-18 ENCOUNTER — Encounter (INDEPENDENT_AMBULATORY_CARE_PROVIDER_SITE_OTHER): Payer: Self-pay

## 2016-02-18 ENCOUNTER — Ambulatory Visit (HOSPITAL_COMMUNITY)
Admission: RE | Admit: 2016-02-18 | Discharge: 2016-02-18 | Disposition: A | Discharge status: Home / Self Care | Payer: 59 | Source: Ambulatory Visit | Attending: Cardiovascular Disease | Admitting: Cardiovascular Disease

## 2016-02-18 DIAGNOSIS — I6523 Occlusion and stenosis of bilateral carotid arteries: Secondary | ICD-10-CM | POA: Diagnosis not present

## 2016-02-18 DIAGNOSIS — I2510 Atherosclerotic heart disease of native coronary artery without angina pectoris: Secondary | ICD-10-CM | POA: Diagnosis not present

## 2016-02-18 DIAGNOSIS — I779 Disorder of arteries and arterioles, unspecified: Secondary | ICD-10-CM | POA: Insufficient documentation

## 2016-02-18 DIAGNOSIS — Z79899 Other long term (current) drug therapy: Secondary | ICD-10-CM | POA: Diagnosis not present

## 2016-02-18 DIAGNOSIS — E785 Hyperlipidemia, unspecified: Secondary | ICD-10-CM | POA: Insufficient documentation

## 2016-02-18 DIAGNOSIS — I6521 Occlusion and stenosis of right carotid artery: Secondary | ICD-10-CM | POA: Diagnosis not present

## 2016-02-18 DIAGNOSIS — I10 Essential (primary) hypertension: Secondary | ICD-10-CM | POA: Diagnosis not present

## 2016-02-18 DIAGNOSIS — I739 Peripheral vascular disease, unspecified: Secondary | ICD-10-CM

## 2016-02-18 DIAGNOSIS — N2889 Other specified disorders of kidney and ureter: Secondary | ICD-10-CM | POA: Insufficient documentation

## 2016-02-19 ENCOUNTER — Other Ambulatory Visit: Payer: Self-pay | Admitting: *Deleted

## 2016-02-19 LAB — HEPATIC FUNCTION PANEL
ALK PHOS: 89 U/L (ref 33–130)
ALT: 15 U/L (ref 6–29)
AST: 20 U/L (ref 10–35)
Albumin: 4.3 g/dL (ref 3.6–5.1)
BILIRUBIN DIRECT: 0.1 mg/dL (ref <=0.2)
BILIRUBIN INDIRECT: 0.5 mg/dL (ref 0.2–1.2)
TOTAL PROTEIN: 6.9 g/dL (ref 6.1–8.1)
Total Bilirubin: 0.6 mg/dL (ref 0.2–1.2)

## 2016-02-19 LAB — LIPID PANEL
Cholesterol: 153 mg/dL (ref 125–200)
HDL: 50 mg/dL (ref >=46)
LDL CALC: 63 mg/dL (ref <130)
Total CHOL/HDL Ratio: 3.1 Ratio (ref <=5.0)
Triglycerides: 198 mg/dL — ABNORMAL HIGH (ref <150)
VLDL: 40 mg/dL — AB (ref <30)

## 2016-02-19 MED ORDER — AMLODIPINE BESYLATE 5 MG PO TABS: ORAL_TABLET | ORAL | 3 refills | Status: DC

## 2016-02-19 MED ORDER — ROSUVASTATIN CALCIUM 20 MG PO TABS: 20.0000 mg | ORAL_TABLET | Freq: Every day | ORAL | 3 refills | Status: DC

## 2016-02-19 MED ORDER — CLOPIDOGREL BISULFATE 75 MG PO TABS: 75.0000 mg | ORAL_TABLET | Freq: Every day | ORAL | 3 refills | Status: DC

## 2016-02-19 MED ORDER — METOPROLOL SUCCINATE ER 100 MG PO TB24: ORAL_TABLET | ORAL | 3 refills | Status: DC

## 2016-02-19 MED ORDER — LOSARTAN POTASSIUM 100 MG PO TABS: ORAL_TABLET | ORAL | 3 refills | Status: DC

## 2016-02-19 MED ORDER — NIACIN ER (ANTIHYPERLIPIDEMIC) 1000 MG PO TBCR: 1000.0000 mg | EXTENDED_RELEASE_TABLET | Freq: Every day | ORAL | 3 refills | Status: DC

## 2016-02-19 NOTE — Telephone Encounter (Signed)
Patient [facility]. Rx sent.

## 2016-02-21 NOTE — Addendum Note (Signed)
Addended by: Freddi StarrMATHIS, Almeter Westhoff W on: 02/21/2016 05:58 PM   Modules accepted: Orders

## 2016-03-16 ENCOUNTER — Emergency Department (HOSPITAL_COMMUNITY): Payer: 59

## 2016-03-16 ENCOUNTER — Emergency Department (HOSPITAL_COMMUNITY)
Admission: EM | Admit: 2016-03-16 | Discharge: 2016-03-17 | Disposition: A | Discharge status: Home / Self Care | Payer: 59 | Attending: Emergency Medicine | Admitting: Emergency Medicine

## 2016-03-16 DIAGNOSIS — Z955 Presence of coronary angioplasty implant and graft: Secondary | ICD-10-CM | POA: Diagnosis not present

## 2016-03-16 DIAGNOSIS — R319 Hematuria, unspecified: Secondary | ICD-10-CM | POA: Diagnosis not present

## 2016-03-16 DIAGNOSIS — I10 Essential (primary) hypertension: Secondary | ICD-10-CM | POA: Insufficient documentation

## 2016-03-16 DIAGNOSIS — I2510 Atherosclerotic heart disease of native coronary artery without angina pectoris: Secondary | ICD-10-CM | POA: Insufficient documentation

## 2016-03-16 DIAGNOSIS — Z79899 Other long term (current) drug therapy: Secondary | ICD-10-CM | POA: Diagnosis not present

## 2016-03-16 DIAGNOSIS — R109 Unspecified abdominal pain: Secondary | ICD-10-CM | POA: Insufficient documentation

## 2016-03-16 DIAGNOSIS — Z7982 Long term (current) use of aspirin: Secondary | ICD-10-CM | POA: Insufficient documentation

## 2016-03-16 LAB — CBC WITH DIFFERENTIAL/PLATELET
Basophils Absolute: 0 10*3/uL (ref 0.0–0.1)
Basophils Relative: 0 %
EOS ABS: 0.1 10*3/uL (ref 0.0–0.7)
EOS PCT: 1 %
HCT: 39.1 % (ref 36.0–46.0)
Hemoglobin: 13 g/dL (ref 12.0–15.0)
LYMPHS ABS: 1.3 10*3/uL (ref 0.7–4.0)
LYMPHS PCT: 11 %
MCH: 28.4 pg (ref 26.0–34.0)
MCHC: 33.2 g/dL (ref 30.0–36.0)
MCV: 85.6 fL (ref 78.0–100.0)
MONO ABS: 0.6 10*3/uL (ref 0.1–1.0)
Monocytes Relative: 5 %
Neutro Abs: 9.7 10*3/uL — ABNORMAL HIGH (ref 1.7–7.7)
Neutrophils Relative %: 83 %
PLATELETS: 217 10*3/uL (ref 150–400)
RBC: 4.57 MIL/uL (ref 3.87–5.11)
RDW: 14.2 % (ref 11.5–15.5)
WBC: 11.8 10*3/uL — ABNORMAL HIGH (ref 4.0–10.5)

## 2016-03-16 LAB — COMPREHENSIVE METABOLIC PANEL
ALT: 20 U/L (ref 14–54)
ANION GAP: 6 (ref 5–15)
AST: 27 U/L (ref 15–41)
Albumin: 4.1 g/dL (ref 3.5–5.0)
Alkaline Phosphatase: 80 U/L (ref 38–126)
BUN: 26 mg/dL — ABNORMAL HIGH (ref 6–20)
CHLORIDE: 109 mmol/L (ref 101–111)
CO2: 24 mmol/L (ref 22–32)
Calcium: 9.3 mg/dL (ref 8.9–10.3)
Creatinine, Ser: 1.32 mg/dL — ABNORMAL HIGH (ref 0.44–1.00)
GFR, EST AFRICAN AMERICAN: 50 mL/min — AB (ref >60)
GFR, EST NON AFRICAN AMERICAN: 43 mL/min — AB (ref >60)
Glucose, Bld: 172 mg/dL — ABNORMAL HIGH (ref 65–99)
POTASSIUM: 4.2 mmol/L (ref 3.5–5.1)
Sodium: 139 mmol/L (ref 135–145)
Total Bilirubin: 0.4 mg/dL (ref 0.3–1.2)
Total Protein: 7.6 g/dL (ref 6.5–8.1)

## 2016-03-16 LAB — URINE MICROSCOPIC-ADD ON

## 2016-03-16 LAB — URINALYSIS, ROUTINE W REFLEX MICROSCOPIC
BILIRUBIN URINE: NEGATIVE
Glucose, UA: NEGATIVE mg/dL
KETONES UR: NEGATIVE mg/dL
NITRITE: NEGATIVE
Protein, ur: NEGATIVE mg/dL
SPECIFIC GRAVITY, URINE: 1.023 (ref 1.005–1.030)
pH: 5 (ref 5.0–8.0)

## 2016-03-16 NOTE — ED Notes (Signed)
No respiratory or acute distress noted alert and oriented x 3 visitors at bedside call light in reach. 

## 2016-03-16 NOTE — ED Notes (Signed)
RN drawing labs 

## 2016-03-16 NOTE — ED Triage Notes (Signed)
Pt states that she started having L sided flank pain 1 hour ago. Denies hx of kidney stone. States she has not urinated since it started. Alert and oriented.

## 2016-03-16 NOTE — ED Provider Notes (Signed)
[facility]-EMERGENCY DEPT Provider Note   CSN: [id number] Arrival date & time: 03/16/16  2041     History   Chief Complaint Chief Complaint  Patient presents with  . Flank Pain    HPI [name] is a 60 y.o. female.  The history is provided by the patient.  Flank Pain  This is a new problem. The current episode started 3 to 5 hours ago. The problem occurs constantly (fluctuating). The problem has not changed since onset.Pertinent negatives include no chest pain, no abdominal pain, no headaches and no shortness of breath. Nothing aggravates the symptoms. Relieved by: passing gas and urinating. She has tried nothing for the symptoms.    Past Medical History:  Diagnosis Date  . CAD (coronary atherosclerotic disease)    cabg 2005; myoview 04/03/11- no ischemia  . Carotid artery stenosis     right internal carotid artery stent-assisted angioplasty5/2010; carotid doppler 04/19/12-left CCA distal-50-69%, left bulb-70-99%; left prox ICA-post stenotic turbulent flow, 50-69% diameter reduction  . HTN (hypertension)   . Hyperlipidemia   . Murmur, cardiac    echo 10/21/99-normal systolic function, aortic sclerosis without aortic stenosis and mild mitral valve prolapse  . PVD (peripheral vascular disease) (HCC)    lower extremity dopplers 08/20/11-normal bilateral ABIs; abd aorta distal-equal to or >50% diameter reduction; right distal CIA-equal to or >50% diameter reduction  . Renal artery stenosis (HCC)    renal doppler 04/19/12-right renal artery-known occluded vessel; left renal artery stent-60-90% instent restenosis    Patient Active Problem List   Diagnosis Date Noted  . Coronary artery disease 12/15/2014  . Renal vascular disease 12/15/2014  . Unilateral carotid artery disease (HCC) 12/15/2014  . Essential hypertension 12/15/2014  . Hyperlipidemia 12/15/2014    Past Surgical History:  Procedure Laterality Date  . CARDIAC CATHETERIZATION  08/05/2007   high grade left main and  2V  CAD; patent RIMA to LAD, patent vein graft to OM, totally occluded vein graft to diagonal; nl LV function; 60% distal aortic stenosis infrarenal with samll infrarenal aneurysm present  . CARDIAC CATHETERIZATION  09/12/2003   ef 45-50%; significant left main with 2V CAD; totally occluded right renal artery  . CAROTID ANGIOGRAM  11/07/2008   L common CA-mild calcification and mild disease; LICA-mild diffuse disesease with 30% stenosis; L external carotid- 90% stenosis; left vertebral- complicated filling, filling the contralateral vertebral arterial system; right vertebral artery-patent;   . CAROTID ANGIOGRAM  12/07/2008   right internal carotid artery stent-assisted angioplasty  . CORONARY ARTERY BYPASS GRAFT  09/13/2003   free RIMA to LAD and a vein to an OM branch  . RENAL ANGIOGRAM  11/28/99   chronic totally occluded right renal artery; 50% infrarenal abdominal aortic stenosi, bilateral iliac disease and moderately severe left external iliac. high grade stenosis of left profunda nd high grade focal stenosis of prox RSFA  . RENAL ARTERY STENT  12/23/2008   PTA and stenting of left renal artery with 5.5x79mm Herculink Plus stent     OB History    No data available       Home Medications    Prior to Admission medications   Medication Sig Start Date End Date Taking? Authorizing Provider  amLODipine (NORVASC) 5 MG tablet TAKE ONE (1) TABLET BY MOUTH EVERY DAY 02/19/16  Yes Runell Gess, MD  aspirin EC 81 MG tablet Take 81 mg by mouth every evening.   Yes Historical Provider, MD  clopidogrel (PLAVIX) 75 MG tablet Take 1 tablet (75 mg total)  by mouth daily. 02/19/16  Yes Runell Gess, MD  ibuprofen (ADVIL,MOTRIN) 200 MG tablet Take 400 mg by mouth every 8 (eight) hours as needed (for pain).   Yes Historical Provider, MD  losartan (COZAAR) 100 MG tablet TAKE ONE (1) TABLET BY MOUTH EVERY DAY 02/19/16  Yes Runell Gess, MD  metoprolol succinate (TOPROL-XL) 100 MG 24 hr tablet TAKE ONE (1)  TABLET BY MOUTH EVERY DAY 02/19/16  Yes Runell Gess, MD  Multiple Vitamin (MULTIVITAMIN WITH MINERALS) TABS tablet Take 1 tablet by mouth daily. Centrum   Yes Historical Provider, MD  niacin (NIASPAN) 1000 MG CR tablet Take 1 tablet (1,000 mg total) by mouth at bedtime. 02/19/16  Yes Runell Gess, MD  rosuvastatin (CRESTOR) 20 MG tablet Take 1 tablet (20 mg total) by mouth daily. 02/19/16  Yes Runell Gess, MD    Family History No family history on file.  Social History Social History  Substance Use Topics  . Smoking status: Never Smoker  . Smokeless tobacco: Not on file  . Alcohol use Not on file     Allergies   Contrast media [iodinated diagnostic agents]   Review of Systems Review of Systems  Constitutional: Negative for chills, fatigue and fever.  HENT: Negative for congestion and sore throat.   Eyes: Negative for visual disturbance.  Respiratory: Negative for cough, chest tightness and shortness of breath.   Cardiovascular: Negative for chest pain and palpitations.  Gastrointestinal: Negative for abdominal distention, abdominal pain, blood in stool, diarrhea, nausea and vomiting.  Genitourinary: Positive for flank pain. Negative for decreased urine volume and difficulty urinating.  Musculoskeletal: Negative for back pain and neck stiffness.  Skin: Negative for rash.  Neurological: Negative for light-headedness and headaches.  Psychiatric/Behavioral: Negative for confusion.  All other systems reviewed and are negative.    Physical Exam Updated Vital Signs BP 186/78 (BP Location: Right Arm)   Pulse 72   Resp 22   Ht 5\' 3"  (1.6 m)   Wt 207 lb (93.9 kg)   SpO2 97%   BMI 36.67 kg/m   Physical Exam  Constitutional: She is oriented to person, place, and time. She appears well-developed and well-nourished. No distress.  HENT:  Head: Normocephalic and atraumatic.  Nose: Nose normal.  Eyes: Conjunctivae and EOM are normal. Pupils are equal, round, and reactive  to light. Right eye exhibits no discharge. Left eye exhibits no discharge. No scleral icterus.  Neck: Normal range of motion. Neck supple.  Cardiovascular: Normal rate and regular rhythm.  Exam reveals no gallop and no friction rub.   No murmur heard. Pulmonary/Chest: Effort normal and breath sounds normal. No stridor. No respiratory distress. She has no rales.  Abdominal: Soft. She exhibits no distension. There is no tenderness.  Musculoskeletal: She exhibits no edema or tenderness.  Neurological: She is alert and oriented to person, place, and time.  Skin: Skin is warm and dry. No rash noted. She is not diaphoretic. No erythema.  Psychiatric: She has a normal mood and affect.  Vitals reviewed.    ED Treatments / Results  Labs (all labs ordered are listed, but only abnormal results are displayed) Labs Reviewed  URINALYSIS, ROUTINE W REFLEX MICROSCOPIC (NOT AT [facility]) - Abnormal; Notable for the following:       Result Value   APPearance CLOUDY (*)    Hgb urine dipstick LARGE (*)    Leukocytes, UA SMALL (*)    All other components within normal limits  URINE MICROSCOPIC-ADD  ON - Abnormal; Notable for the following:    Squamous Epithelial / LPF 0-5 (*)    Bacteria, UA RARE (*)    Casts HYALINE CASTS (*)    All other components within normal limits  CBC WITH DIFFERENTIAL/PLATELET - Abnormal; Notable for the following:    WBC 11.8 (*)    Neutro Abs 9.7 (*)    All other components within normal limits  COMPREHENSIVE METABOLIC PANEL - Abnormal; Notable for the following:    Glucose, Bld 172 (*)    BUN 26 (*)    Creatinine, Ser 1.32 (*)    GFR calc non Af Amer 43 (*)    GFR calc Af Amer 50 (*)    All other components within normal limits    EKG  EKG Interpretation None       Radiology Ct Renal Stone Study  Result Date: 03/16/2016 CLINICAL DATA:  60 year old female with left flank pain. EXAM: CT ABDOMEN AND PELVIS WITHOUT CONTRAST TECHNIQUE: Multidetector CT imaging of the  abdomen and pelvis was performed following the standard protocol without IV contrast. COMPARISON:  CT dated 08/03/2013 FINDINGS: Evaluation of this exam is limited in the absence of intravenous contrast. The visualized lung bases are clear. There is coronary vascular calcification. No intra-abdominal free air or free fluid. The liver, gallbladder, pancreas, spleen, adrenal glands appear unremarkable. Atrophic right kidney. A 1.1 cm right renal hypodense lesion is not characterized on this noncontrast study but appears similar to prior study and likely represents a cyst. The left kidney appears unremarkable. There is no hydronephrosis or nephrolithiasis. The visualized ureters and urinary bladder appear unremarkable. The uterus is anteverted, enlarged and myomatous. Multiple coarse uterine calcifications noted. The ovaries are not well visualized. Evaluation of the bowel is limited in the absence of oral contrast. Multiple normal caliber fecalized loops of distal small bowel noted suggestive of chronic stasis. There is no evidence of bowel obstruction or active inflammation. Normal appendix. There is advanced aortoiliac atherosclerotic disease. The aorta is tortuous. There is focal narrowing of the infrarenal abdominal aorta approximately 3.4 cm distal to the renal arteries similar to prior study. The abdominal aorta is ectatic 2.8 cm in diameter. Evaluation of the vasculature is limited in the absence of intravenous contrast. No portal venous gas identified. There is no adenopathy. The abdominal wall soft tissues appear unremarkable. There is osteopenia with degenerative changes of the spine most prominent at L4-L5 where there is disc space narrowing and endplate irregularity. No acute fracture. IMPRESSION: No acute intra-abdominal pelvic pathology. Specifically there is no hydronephrosis or nephrolithiasis. Nonacute findings as described above. Electronically Signed   By: Elgie CollardArash  Radparvar M.D.   On: 03/16/2016  22:47    Procedures Procedures (including critical care time)  Medications Ordered in ED Medications - No data to display   Initial Impression / Assessment and Plan / ED Course  I have reviewed the triage vital signs and the nursing notes.  Pertinent labs & imaging results that were available during my care of the patient were reviewed by me and considered in my medical decision making (see chart for details).  Clinical Course    Highly suspicious for kidney stone. Pain is improved since urination. Abdomen benign. UA with hematuria without evidence of infection.. Since patient does not have a history of prior stones, we'll obtain a CT stone study.   CT with no evidence of stone. No other acute pathology noted on scan. Low suspicion for diverticulitis.  Patient safe for discharge with  strict return precautions. She is to follow-up with her primary care provider as needed.  Final Clinical Impressions(s) / ED Diagnoses   Final diagnoses:  Left flank pain  Hematuria   Disposition: Discharge  Condition: Good  I have discussed the results, Dx and Tx plan with the patient who expressed understanding and agree(s) with the plan. Discharge instructions discussed at great length. The patient was given strict return precautions who verbalized understanding of the instructions. No further questions at time of discharge.    Current Discharge Medication List      Follow Up: Alysia Penna, MD [address] [place] [address] [phone]  Schedule an appointment as soon as possible for a visit in 2 days For close follow up to assess for, If symptoms do not improve or  worsen      Nira Conn, MD 03/17/16 [phone]

## 2016-04-12 DIAGNOSIS — H524 Presbyopia: Secondary | ICD-10-CM | POA: Diagnosis not present

## 2016-05-21 ENCOUNTER — Other Ambulatory Visit: Payer: Self-pay | Admitting: Cardiovascular Disease

## 2016-05-21 DIAGNOSIS — N2889 Other specified disorders of kidney and ureter: Secondary | ICD-10-CM

## 2016-05-28 ENCOUNTER — Encounter (HOSPITAL_COMMUNITY): Payer: 59

## 2016-06-10 ENCOUNTER — Other Ambulatory Visit: Payer: Self-pay | Admitting: Cardiovascular Disease

## 2016-06-10 DIAGNOSIS — I701 Atherosclerosis of renal artery: Secondary | ICD-10-CM

## 2016-06-10 DIAGNOSIS — N2889 Other specified disorders of kidney and ureter: Secondary | ICD-10-CM

## 2016-06-18 ENCOUNTER — Ambulatory Visit (HOSPITAL_COMMUNITY)
Admission: RE | Admit: 2016-06-18 | Discharge: 2016-06-18 | Disposition: A | Discharge status: Home / Self Care | Payer: 59 | Source: Ambulatory Visit | Attending: Cardiovascular Disease | Admitting: Cardiovascular Disease

## 2016-06-18 DIAGNOSIS — I700 Atherosclerosis of aorta: Secondary | ICD-10-CM | POA: Diagnosis not present

## 2016-06-18 DIAGNOSIS — N2889 Other specified disorders of kidney and ureter: Secondary | ICD-10-CM | POA: Diagnosis not present

## 2016-06-18 DIAGNOSIS — I701 Atherosclerosis of renal artery: Secondary | ICD-10-CM | POA: Insufficient documentation

## 2016-06-18 DIAGNOSIS — I708 Atherosclerosis of other arteries: Secondary | ICD-10-CM | POA: Insufficient documentation

## 2016-06-23 ENCOUNTER — Other Ambulatory Visit: Payer: Self-pay | Admitting: Cardiovascular Disease

## 2016-06-23 DIAGNOSIS — N2889 Other specified disorders of kidney and ureter: Secondary | ICD-10-CM

## 2016-06-24 DIAGNOSIS — F331 Major depressive disorder, recurrent, moderate: Secondary | ICD-10-CM | POA: Diagnosis not present

## 2016-06-24 DIAGNOSIS — Z6835 Body mass index (BMI) 35.0-35.9, adult: Secondary | ICD-10-CM | POA: Diagnosis not present

## 2016-12-24 DIAGNOSIS — Z0000 Encounter for general adult medical examination without abnormal findings: Secondary | ICD-10-CM | POA: Diagnosis not present

## 2016-12-24 DIAGNOSIS — I10 Essential (primary) hypertension: Secondary | ICD-10-CM | POA: Diagnosis not present

## 2016-12-31 DIAGNOSIS — E784 Other hyperlipidemia: Secondary | ICD-10-CM | POA: Diagnosis not present

## 2016-12-31 DIAGNOSIS — I10 Essential (primary) hypertension: Secondary | ICD-10-CM | POA: Diagnosis not present

## 2016-12-31 DIAGNOSIS — I712 Thoracic aortic aneurysm, without rupture: Secondary | ICD-10-CM | POA: Diagnosis not present

## 2016-12-31 DIAGNOSIS — I714 Abdominal aortic aneurysm, without rupture: Secondary | ICD-10-CM | POA: Diagnosis not present

## 2016-12-31 DIAGNOSIS — Z1389 Encounter for screening for other disorder: Secondary | ICD-10-CM | POA: Diagnosis not present

## 2016-12-31 DIAGNOSIS — F331 Major depressive disorder, recurrent, moderate: Secondary | ICD-10-CM | POA: Diagnosis not present

## 2016-12-31 DIAGNOSIS — Z6835 Body mass index (BMI) 35.0-35.9, adult: Secondary | ICD-10-CM | POA: Diagnosis not present

## 2016-12-31 DIAGNOSIS — Z1212 Encounter for screening for malignant neoplasm of rectum: Secondary | ICD-10-CM | POA: Diagnosis not present

## 2016-12-31 DIAGNOSIS — Z0000 Encounter for general adult medical examination without abnormal findings: Secondary | ICD-10-CM | POA: Diagnosis not present

## 2016-12-31 DIAGNOSIS — Z955 Presence of coronary angioplasty implant and graft: Secondary | ICD-10-CM | POA: Diagnosis not present

## 2017-03-05 ENCOUNTER — Other Ambulatory Visit: Payer: Self-pay | Admitting: Cardiovascular Disease

## 2017-05-07 ENCOUNTER — Other Ambulatory Visit: Payer: Self-pay | Admitting: *Deleted

## 2017-05-07 DIAGNOSIS — I6523 Occlusion and stenosis of bilateral carotid arteries: Secondary | ICD-10-CM

## 2017-05-12 IMAGING — CT CT RENAL STONE PROTOCOL
2 of 3 series · 16 of 46 positions shown, 18 images · non-contrast
Comparison: CT dated 08/03/2013

CLINICAL DATA: 59-year-old female with left flank pain.

EXAM:
CT ABDOMEN AND PELVIS WITHOUT CONTRAST
TECHNIQUE: Multidetector CT imaging of the abdomen and pelvis was performed
following the standard protocol without IV contrast.

[Series 3: coronal · coronal · 0.74mm/px · 3 of 181 slices shown]
[im 61/181  soft-tissue]
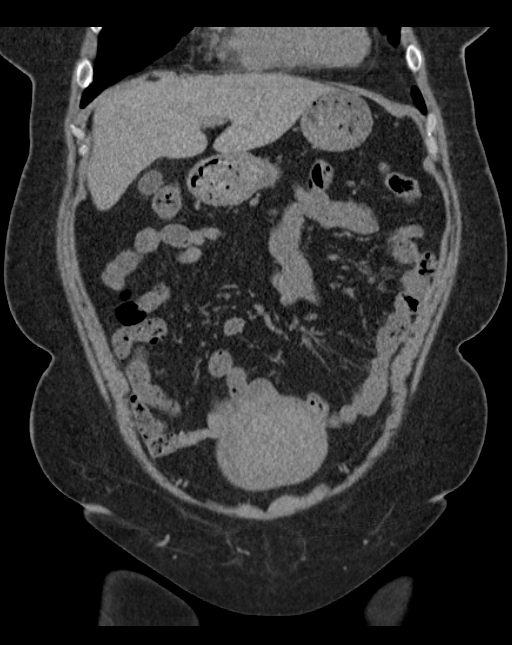
[im 81/181  soft-tissue]
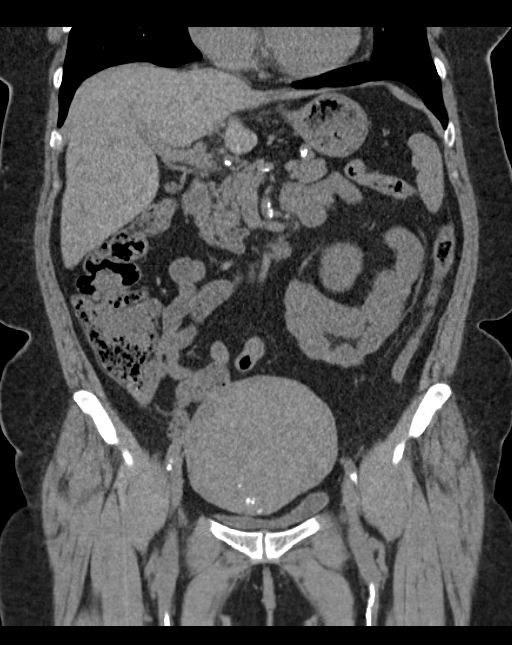
[im 101/181  soft-tissue]
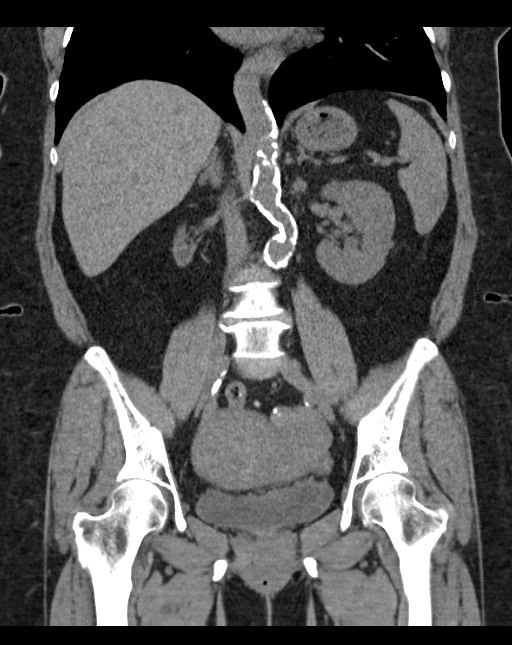

[Series 6: lung · axial · 0.79mm/px · z∈[-58,+76]mm · 13 of 77 slices shown, 15 images]
[im 5/77  soft-tissue]
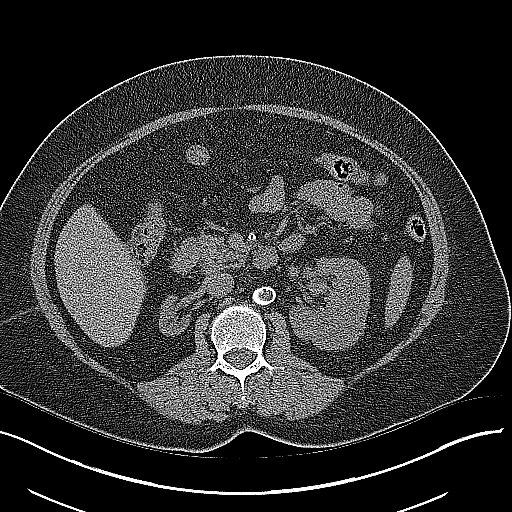
[im 5/77  bone]
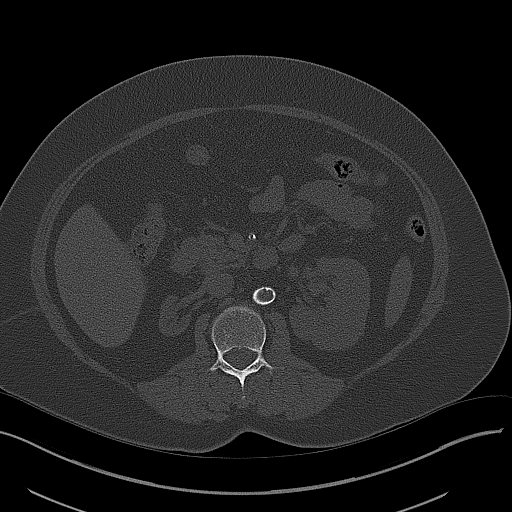
[im 10/77  soft-tissue]
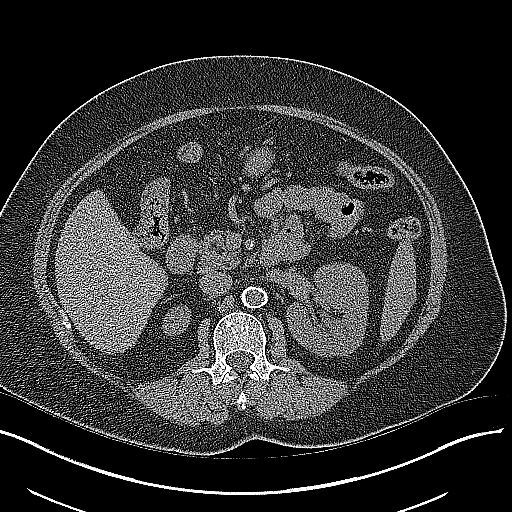
[im 15/77  soft-tissue]
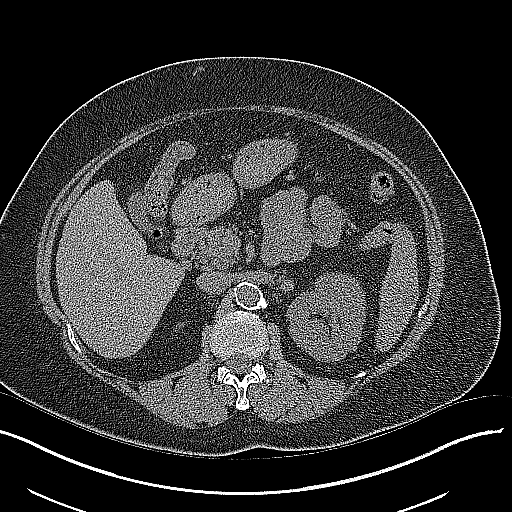
[im 23/77  soft-tissue]
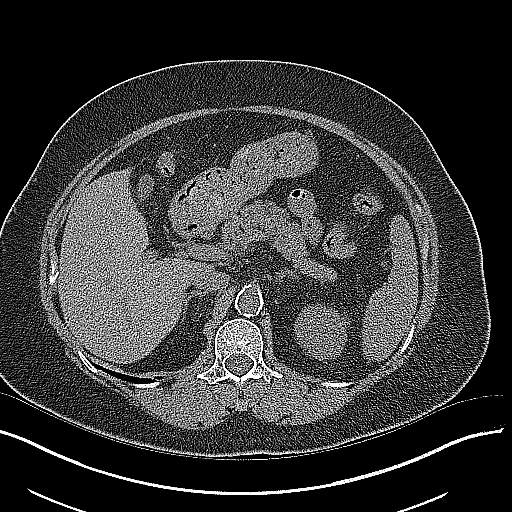
[im 27/77  soft-tissue]
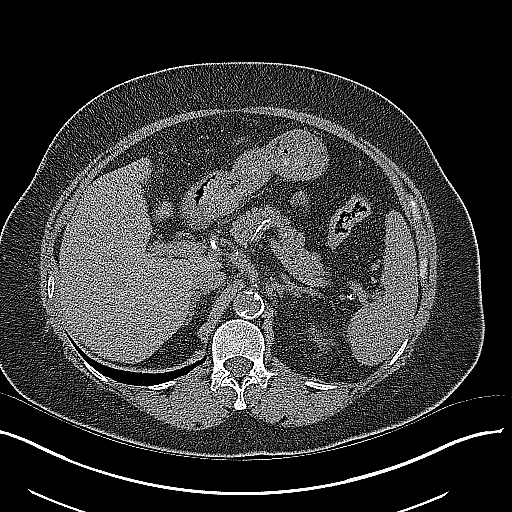
[im 32/77  soft-tissue]
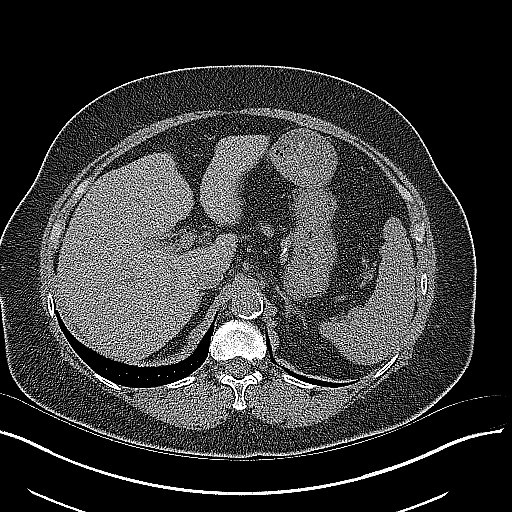
[im 40/77  soft-tissue]
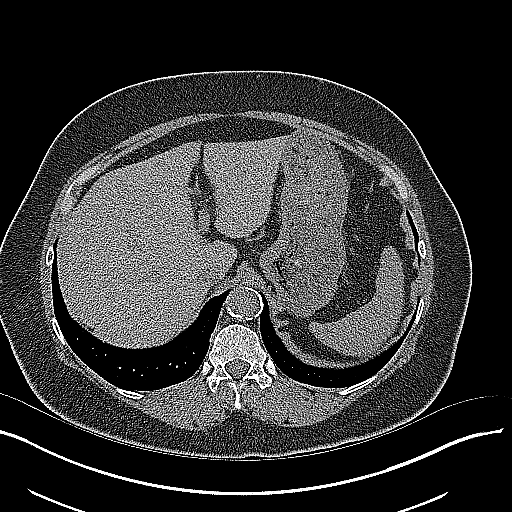
[im 45/77  soft-tissue]
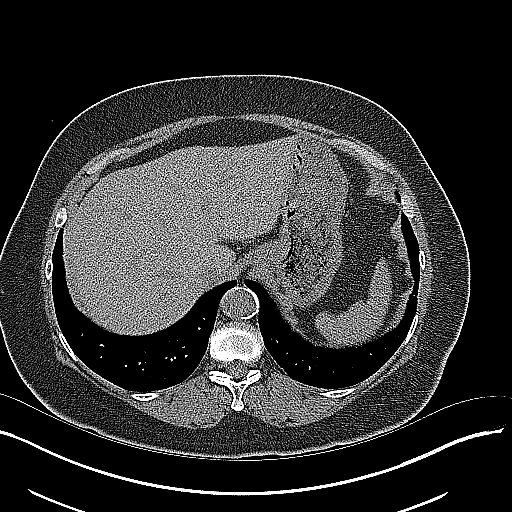
[im 50/77  soft-tissue]
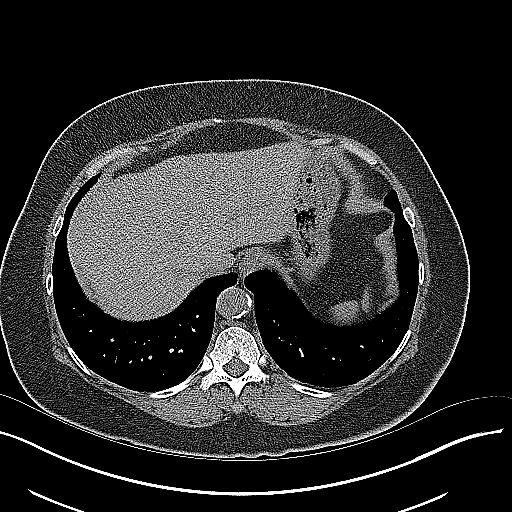
[im 50/77  bone]
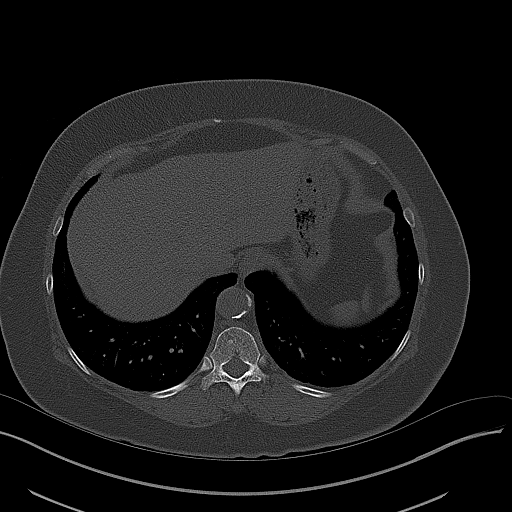
[im 54/77  soft-tissue]
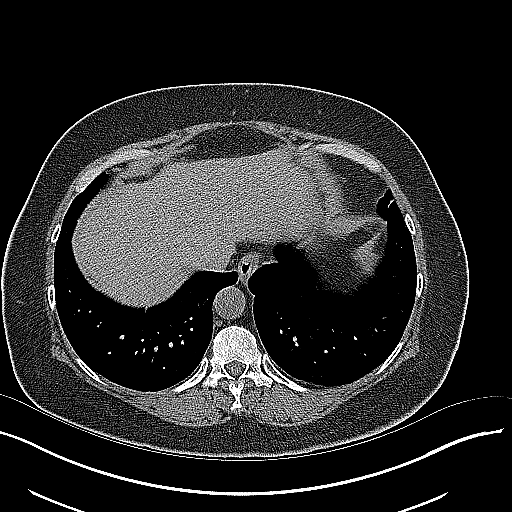
[im 62/77  soft-tissue]
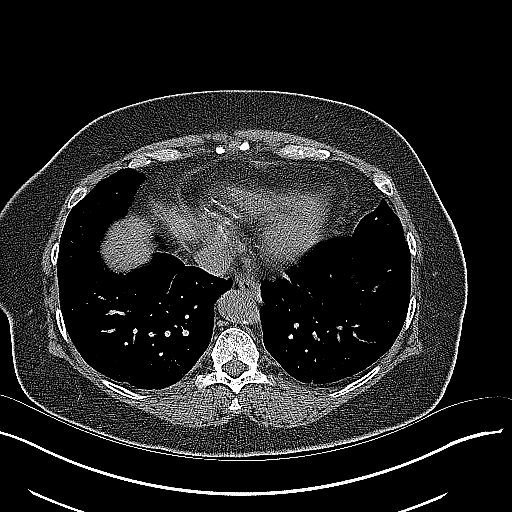
[im 67/77  soft-tissue]
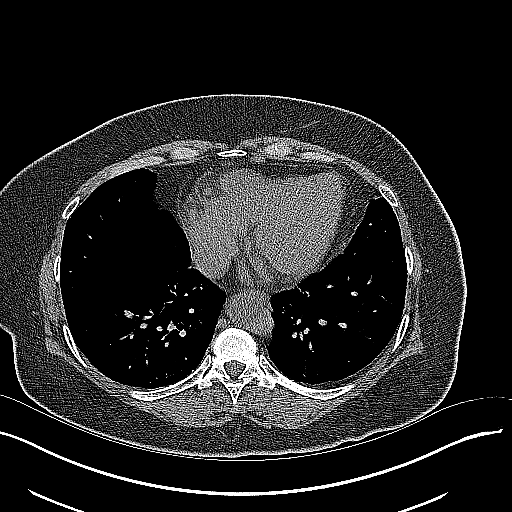
[im 72/77  soft-tissue]
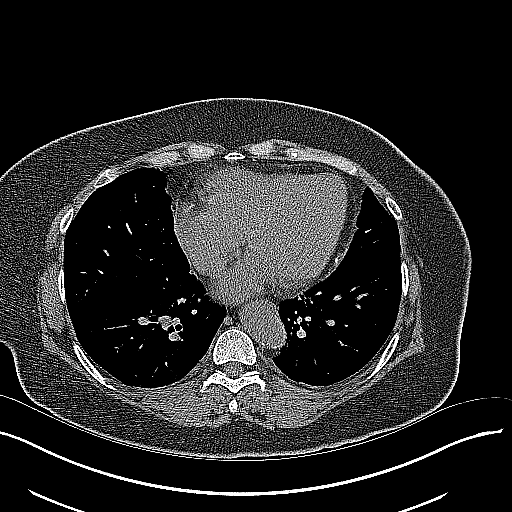

[16 of 46 positions shown; findings below may reference images not displayed]

FINDINGS: Evaluation of this exam is limited in the absence of intravenous
contrast.

The visualized lung bases are clear. There is coronary vascular
calcification.

No intra-abdominal free air or free fluid.

The liver, gallbladder, pancreas, spleen, adrenal glands appear
unremarkable. Atrophic right kidney. A 1.1 cm right renal hypodense
lesion is not characterized on this noncontrast study but appears
similar to prior study and likely represents a cyst. The left kidney
appears unremarkable. There is no hydronephrosis or nephrolithiasis.
The visualized ureters and urinary bladder appear unremarkable. The
uterus is anteverted, enlarged and myomatous. Multiple coarse
uterine calcifications noted. The ovaries are not well visualized.

Evaluation of the bowel is limited in the absence of oral contrast.
Multiple normal caliber fecalized loops of distal small bowel noted
suggestive of chronic stasis. There is no evidence of bowel
obstruction or active inflammation. Normal appendix.

There is advanced aortoiliac atherosclerotic disease. The aorta is
tortuous. There is focal narrowing of the infrarenal abdominal aorta
approximately 3.4 cm distal to the renal arteries similar to prior
study. The abdominal aorta is ectatic 2.8 cm in diameter. Evaluation
of the vasculature is limited in the absence of intravenous
contrast. No portal venous gas identified. There is no adenopathy.
The abdominal wall soft tissues appear unremarkable. There is
osteopenia with degenerative changes of the spine most prominent at
L4-L5 where there is disc space narrowing and endplate irregularity.
No acute fracture.
IMPRESSION: No acute intra-abdominal pelvic pathology. Specifically there is no
hydronephrosis or nephrolithiasis.

Nonacute findings as described above.

## 2017-07-02 ENCOUNTER — Ambulatory Visit (HOSPITAL_COMMUNITY)
Admission: RE | Admit: 2017-07-02 | Discharge: 2017-07-02 | Disposition: A | Discharge status: Home / Self Care | Payer: 59 | Source: Ambulatory Visit | Attending: Cardiology | Admitting: Cardiology

## 2017-07-02 ENCOUNTER — Ambulatory Visit (HOSPITAL_BASED_OUTPATIENT_CLINIC_OR_DEPARTMENT_OTHER)
Admission: RE | Admit: 2017-07-02 | Discharge: 2017-07-02 | Disposition: A | Discharge status: Home / Self Care | Payer: 59 | Source: Ambulatory Visit | Attending: Cardiovascular Disease | Admitting: Cardiovascular Disease

## 2017-07-02 DIAGNOSIS — I6523 Occlusion and stenosis of bilateral carotid arteries: Secondary | ICD-10-CM

## 2017-07-02 DIAGNOSIS — N2889 Other specified disorders of kidney and ureter: Secondary | ICD-10-CM | POA: Diagnosis not present

## 2017-07-03 ENCOUNTER — Encounter: Payer: Self-pay | Admitting: Cardiovascular Disease

## 2017-07-03 ENCOUNTER — Ambulatory Visit (INDEPENDENT_AMBULATORY_CARE_PROVIDER_SITE_OTHER): Payer: 59 | Admitting: Cardiovascular Disease

## 2017-07-03 VITALS — BP 155/63 | HR 60 | Ht 63.0 in | Wt 201.6 lb

## 2017-07-03 DIAGNOSIS — I10 Essential (primary) hypertension: Secondary | ICD-10-CM

## 2017-07-03 DIAGNOSIS — E7800 Pure hypercholesterolemia, unspecified: Secondary | ICD-10-CM | POA: Diagnosis not present

## 2017-07-03 DIAGNOSIS — I739 Peripheral vascular disease, unspecified: Secondary | ICD-10-CM

## 2017-07-03 DIAGNOSIS — N2889 Other specified disorders of kidney and ureter: Secondary | ICD-10-CM

## 2017-07-03 DIAGNOSIS — I779 Disorder of arteries and arterioles, unspecified: Secondary | ICD-10-CM

## 2017-07-03 NOTE — Assessment & Plan Note (Signed)
History of renal artery stenosis with a known occluded right renal artery status post stenting of her left renal artery 12/25/1053.5 mm x 16 mm long or killing expandable stent. We have been getting annual renal Doppler studies which have remained stable. Her most recent Doppler revealed a left renal aortic ratio of 3.2 with a left renal dimension of 13 cm.

## 2017-07-03 NOTE — Assessment & Plan Note (Signed)
History of hyperlipidemia on statin therapy followed by her PCP. 

## 2017-07-03 NOTE — Assessment & Plan Note (Signed)
History of CAD status post coronary artery bypass grafting March 2005 the LIMA to her LAD, vein to the circumflex and branches. Her last Myoview performed 04/03/11 was nonischemic. She denies chest pain or shortness of breath.

## 2017-07-03 NOTE — Patient Instructions (Signed)
Medication Instructions: Your physician recommends that you continue on your current medications as directed. Please refer to the Current Medication list given to you today.  Testing/Procedures: Your physician has requested that you have a carotid duplex (6 mo). This test is an ultrasound of the carotid arteries in your neck. It looks at blood flow through these arteries that supply the brain with blood. Allow one hour for this exam. There are no restrictions or special instructions.  Your physician has requested that you have a renal artery duplex (1 yr). During this test, an ultrasound is used to evaluate blood flow to the kidneys. Allow one hour for this exam. Do not eat after midnight the day before and avoid carbonated beverages. Take your medications as you usually do.  Follow-Up: Your physician wants you to follow-up in: 1 year with Dr. Allyson SabalBerry. You will receive a reminder letter in the mail two months in advance. If you don't receive a letter, please call our office to schedule the follow-up appointment.  If you need a refill on your cardiac medications before your next appointment, please call your pharmacy.

## 2017-07-03 NOTE — Progress Notes (Signed)
07/03/2017 [name]   1956-02-13  [id number]  Primary Physician Alysia PennaHolwerda, Scott, MD Primary Cardiologist: Runell GessJonathan J Abbigaile Rockman MD FACP, [place], [place], [place]  HPI:  [name] is a 61 y.o.  who I last saw in the office  02/15/16. She has a history of CAD status post CABG March 2005 with a LIMA to LAD, vein graft to the circumflex and diagonal branch. She has a known occluded right renal artery status post left renal artery stenting 12/25/08 with a 5.5 mm x 16 mm long Herculink balloon expandable stent. She's had right intracranial intracarotid intervention by Dr. Titus Dubinevashwar in the past. For the problems include hypertension and hyperlipidemia. She denies chest pain or shortness of breath. Her most recent carotid Dopplers performed 07/02/17 revealed progression of her carotid disease bilaterally with stable mild left renal in-stent restenosis and a known occluded right renal artery. Unfortunately, her husband [name] passed away in May of 2017. She does get occasional atypical chest pain.     Current Meds  Medication Sig  . amLODipine (NORVASC) 5 MG tablet TAKE ONE (1) TABLET BY MOUTH EVERY DAY  . aspirin EC 81 MG tablet Take 81 mg by mouth every evening.  . clopidogrel (PLAVIX) 75 MG tablet TAKE ONE (1) TABLET BY MOUTH EVERY DAY  . ibuprofen (ADVIL,MOTRIN) 200 MG tablet Take 400 mg by mouth every 8 (eight) hours as needed (for pain).  Marland Kitchen. losartan (COZAAR) 100 MG tablet TAKE ONE (1) TABLET BY MOUTH EVERY DAY  . metoprolol succinate (TOPROL-XL) 100 MG 24 hr tablet TAKE ONE (1) TABLET BY MOUTH EVERY DAY  . Multiple Vitamin (MULTIVITAMIN WITH MINERALS) TABS tablet Take 1 tablet by mouth daily. Centrum  . niacin (NIASPAN) 1000 MG CR tablet TAKE ONE TABLET BY MOUTH EVERY NIGHT AT BEDTIME  . rosuvastatin (CRESTOR) 20 MG tablet TAKE ONE (1) TABLET BY MOUTH EVERY DAY     Allergies  Allergen Reactions  . Contrast Media [Iodinated Diagnostic Agents] Itching    Social History   Socioeconomic  History  . Marital status: Widowed    Spouse name: Not on file  . Number of children: Not on file  . Years of education: Not on file  . Highest education level: Not on file  Social Needs  . Financial resource strain: Not on file  . Food insecurity - worry: Not on file  . Food insecurity - inability: Not on file  . Transportation needs - medical: Not on file  . Transportation needs - non-medical: Not on file  Occupational History  . Not on file  Tobacco Use  . Smoking status: Never Smoker  . Smokeless tobacco: Never Used  Substance and Sexual Activity  . Alcohol use: Not on file  . Drug use: Not on file  . Sexual activity: Not on file  Other Topics Concern  . Not on file  Social History Narrative  . Not on file     Review of Systems: General: negative for chills, fever, night sweats or weight changes.  Cardiovascular: negative for chest pain, dyspnea on exertion, edema, orthopnea, palpitations, paroxysmal nocturnal dyspnea or shortness of breath Dermatological: negative for rash Respiratory: negative for cough or wheezing Urologic: negative for hematuria Abdominal: negative for nausea, vomiting, diarrhea, bright red blood per rectum, melena, or hematemesis Neurologic: negative for visual changes, syncope, or dizziness All other systems reviewed and are otherwise negative except as noted above.    Blood pressure (!) 155/63, pulse 60, height 5\' 3"  (1.6 m),  weight 201 lb 9.6 oz (91.4 kg).  General appearance: alert and no distress Neck: no adenopathy, no JVD, supple, symmetrical, trachea midline, thyroid not enlarged, symmetric, no tenderness/mass/nodules and Bilateral carotid bruits Lungs: clear to auscultation bilaterally Heart: regular rate and rhythm, S1, S2 normal, no murmur, click, rub or gallop Extremities: extremities normal, atraumatic, no cyanosis or edema Pulses: 2+ and symmetric Skin: Skin color, texture, turgor normal. No rashes or lesions Neurologic: Alert and  oriented X 3, normal strength and tone. Normal symmetric reflexes. Normal coordination and gait  EKG sinus rhythm at 60 septal Q waves. I personally reviewed this EKG.  ASSESSMENT AND PLAN:   Coronary artery disease History of CAD status post coronary artery bypass grafting March 2005 the LIMA to her LAD, vein to the circumflex and branches. Her last Myoview performed 04/03/11 was nonischemic. She denies chest pain or shortness of breath.  Renal vascular disease History of renal artery stenosis with a known occluded right renal artery status post stenting of her left renal artery 12/25/1053.5 mm x 16 mm long or killing expandable stent. We have been getting annual renal Doppler studies which have remained stable. Her most recent Doppler revealed a left renal aortic ratio of 3.2 with a left renal dimension of 13 cm.  Unilateral carotid artery disease History of carotid artery disease with recent Doppler performed 07/02/17 revealing moderately severe to severe bilateral internal carotid artery stenosis. This represents progression of disease on the right significantly and mildly on the left. She does have bilateral carotid bruits. We will recheck carotid Dopplers in 6 months. She is neurologically symptomatic.  Essential hypertension History of essential hypertension blood pressure measured 160/78. She is on amlodipine, losartan and metoprolol. Continue current meds.  Hyperlipidemia History of hyperlipidemia on statin therapy followed by her PCP      Runell GessJonathan J. Lareen Mullings MD [facility] LPFACP,FACC,FAHA, [facility] [place] 07/03/2017 1:48 PM

## 2017-07-03 NOTE — Assessment & Plan Note (Signed)
History of carotid artery disease with recent Doppler performed 07/02/17 revealing moderately severe to severe bilateral internal carotid artery stenosis. This represents progression of disease on the right significantly and mildly on the left. She does have bilateral carotid bruits. We will recheck carotid Dopplers in 6 months. She is neurologically symptomatic.

## 2017-07-03 NOTE — Assessment & Plan Note (Signed)
History of essential hypertension blood pressure measured 160/78. She is on amlodipine, losartan and metoprolol. Continue current meds.

## 2017-07-10 ENCOUNTER — Other Ambulatory Visit: Payer: Self-pay

## 2017-07-10 DIAGNOSIS — I2510 Atherosclerotic heart disease of native coronary artery without angina pectoris: Secondary | ICD-10-CM

## 2017-07-10 DIAGNOSIS — I10 Essential (primary) hypertension: Secondary | ICD-10-CM

## 2017-07-10 DIAGNOSIS — I779 Disorder of arteries and arterioles, unspecified: Secondary | ICD-10-CM

## 2017-07-10 DIAGNOSIS — I2583 Coronary atherosclerosis due to lipid rich plaque: Secondary | ICD-10-CM

## 2017-07-10 DIAGNOSIS — E7800 Pure hypercholesterolemia, unspecified: Secondary | ICD-10-CM

## 2017-07-10 DIAGNOSIS — I739 Peripheral vascular disease, unspecified: Secondary | ICD-10-CM

## 2017-07-10 NOTE — Progress Notes (Signed)
Hypertension, hyperlipidemia, no history of smoking, coronary artery disease  VAS US RENAL ARTERY DUPLEX  VAS US CAROTID

## 2017-07-15 ENCOUNTER — Ambulatory Visit: Payer: 59 | Admitting: Cardiovascular Disease

## 2017-08-27 ENCOUNTER — Other Ambulatory Visit: Payer: Self-pay | Admitting: *Deleted

## 2017-08-27 MED ORDER — LOSARTAN POTASSIUM 100 MG PO TABS: ORAL_TABLET | ORAL | 3 refills | Status: AC

## 2017-08-27 MED ORDER — NIACIN ER (ANTIHYPERLIPIDEMIC) 1000 MG PO TBCR: 1000.0000 mg | EXTENDED_RELEASE_TABLET | Freq: Every day | ORAL | 3 refills | Status: AC

## 2017-08-27 MED ORDER — METOPROLOL SUCCINATE ER 100 MG PO TB24: ORAL_TABLET | ORAL | 3 refills | Status: AC

## 2017-08-27 MED ORDER — ROSUVASTATIN CALCIUM 20 MG PO TABS: ORAL_TABLET | ORAL | 3 refills | Status: AC

## 2017-08-27 MED ORDER — AMLODIPINE BESYLATE 5 MG PO TABS: ORAL_TABLET | ORAL | 3 refills | Status: AC

## 2017-09-04 ENCOUNTER — Other Ambulatory Visit: Payer: Self-pay | Admitting: Cardiovascular Disease

## 2017-09-04 NOTE — Telephone Encounter (Signed)
REFILL
